# Patient Record
Sex: Male | Born: 1985 | Race: Black or African American | Hispanic: No | Marital: Single | Smoking: Never smoker
Health system: Southern US, Community
[De-identification: ages and names within clinical notes are randomized; demographics above are authoritative.]

## PROBLEM LIST (undated history)

## (undated) DIAGNOSIS — K469 Unspecified abdominal hernia without obstruction or gangrene: Secondary | ICD-10-CM

## (undated) DIAGNOSIS — Z803 Family history of malignant neoplasm of breast: Secondary | ICD-10-CM

## (undated) DIAGNOSIS — R030 Elevated blood-pressure reading, without diagnosis of hypertension: Secondary | ICD-10-CM

## (undated) DIAGNOSIS — M24419 Recurrent dislocation, unspecified shoulder: Secondary | ICD-10-CM

## (undated) DIAGNOSIS — F319 Bipolar disorder, unspecified: Secondary | ICD-10-CM

## (undated) DIAGNOSIS — F29 Unspecified psychosis not due to a substance or known physiological condition: Secondary | ICD-10-CM

## (undated) DIAGNOSIS — F32A Depression, unspecified: Secondary | ICD-10-CM

## (undated) HISTORY — DX: Unspecified abdominal hernia without obstruction or gangrene: K46.9

## (undated) HISTORY — DX: Family history of malignant neoplasm of breast: Z80.3

## (undated) HISTORY — DX: Recurrent dislocation, unspecified shoulder: M24.419

## (undated) HISTORY — DX: Elevated blood-pressure reading, without diagnosis of hypertension: R03.0

## (undated) HISTORY — DX: Bipolar disorder, unspecified: F31.9

## (undated) HISTORY — DX: Depression, unspecified: F32.A

## (undated) HISTORY — DX: Unspecified psychosis not due to a substance or known physiological condition: F29

---

## 2005-09-27 ENCOUNTER — Emergency Department: Admit: 2005-09-27 | Payer: Self-pay | Source: Emergency Department | Admitting: Emergency Medicine

## 2007-02-25 ENCOUNTER — Ambulatory Visit: Payer: Self-pay | Admitting: Internal Medicine

## 2007-03-02 ENCOUNTER — Ambulatory Visit: Payer: Self-pay | Admitting: Internal Medicine

## 2009-12-01 ENCOUNTER — Emergency Department (HOSPITAL_COMMUNITY): Admission: EM | Admit: 2009-12-01 | Discharge: 2009-12-01 | Payer: Self-pay | Admitting: Emergency Medicine

## 2010-04-13 ENCOUNTER — Emergency Department: Admit: 2010-04-13 | Payer: Self-pay | Source: Emergency Department | Admitting: Emergency Medicine

## 2011-04-04 NOTE — Assessment & Plan Note (Signed)
[facility]                        [facility] OFFICE NOTE   NAME:Schoen, EVANGELOS PAULINO                     MRN:          [id number]  DATE:03/02/2007                            DOB:          01-23-1986    SUBJECTIVE:  Raji Glinski was seen on March 02, 2007, for what he  presumed was a spider bite.  On February 25, 2007, he noticed a lump on  his left buttock.  There was progressive redness and swelling associated  with pruritus.  On February 28, 2007, he noted a similar lesion on the  right buttock.  He denies fevers, chills, sweats or weight loss.  He has  been using Cortaid over-the-counter, with minimal benefit.   He has no pet exposure and has not been in a hot tub.  He was on a  riding lawnmower on February 24, 2007, and contacted poison ivy.  He was  seen on February 25, 2007, for contact dermatitis around the left eye.  He  was placed on Cortaid to the lower lid b.i.d. and prednisone in  decreasing doses as well as Xyzal.   The periorbital edema has resolved.  He has minor linear lesions of  contact dermatitis over the left forearm.   PHYSICAL EXAMINATION:  Over the buttocks he has two large plaques, the  left being 7 cm x 4.5 cm and the right being 4.5 cm x 3 cm.  There is a  smaller on the right which is 8 mm x 9 mm and an extension of the left  buttock lesion which is 1.5 cm x 1.5 cm.  It is papular and erythematous  and somewhat urticarial in appearance.  He has mild eschar on the left  buttock and some hyperemia centrally on the right buttock lesion.  There  is mild dermatographia.   IMPRESSION/RECOMMENDATIONS:  The symmetry and lack of exposure suggests  contact dermatitis.  He does wear his trousers low over his hips and is  wearing a belt which appears to be synthetic material.  The most  important thing will be for him to monitor for any agents or materials  with which he comes into contact.  He should avoid sensitizing materials  such as  Lidocaine or triple antibiotic ointment.  A potent topical  steroid will be used along with hydroxyzine when he is not operating a  vehicle.  He should report fever or increasing size of the lesions.  If  there is progression, then aDermatology consultation and possibly biopsy  will be necessary.     Titus Dubin. Alwyn Ren, MD,FACP,FCCP  Electronically Signed    [facility]  DD: 03/02/2007  DT: 03/02/2007  Job #: [id number]

## 2011-05-27 ENCOUNTER — Encounter: Payer: Self-pay | Admitting: Internal Medicine

## 2011-05-27 ENCOUNTER — Ambulatory Visit (INDEPENDENT_AMBULATORY_CARE_PROVIDER_SITE_OTHER): Payer: PRIVATE HEALTH INSURANCE | Admitting: Internal Medicine

## 2011-05-27 DIAGNOSIS — N5089 Other specified disorders of the male genital organs: Secondary | ICD-10-CM

## 2011-05-27 DIAGNOSIS — N508 Other specified disorders of male genital organs: Secondary | ICD-10-CM

## 2011-05-27 DIAGNOSIS — R030 Elevated blood-pressure reading, without diagnosis of hypertension: Secondary | ICD-10-CM

## 2011-05-27 DIAGNOSIS — K4090 Unilateral inguinal hernia, without obstruction or gangrene, not specified as recurrent: Secondary | ICD-10-CM | POA: Insufficient documentation

## 2011-05-27 NOTE — Assessment & Plan Note (Signed)
BP slightly elevated today, no history of hypertension, patient is moderately anxious. Recheck in 6 months, certainly if his blood pressure is elevated at the time of his surgical evaluation, we can start him on medication.

## 2011-05-27 NOTE — Assessment & Plan Note (Signed)
Has a right scrotal mass, on further questioning the patient, he self-exam his testicle frequently and the mass has been present there for while. To confirm that this is a benign finding, will schedule ultrasound..  Recommend to continue with self testicular exam.

## 2011-05-27 NOTE — Assessment & Plan Note (Signed)
Findings consistent with a inguinal hernia, will refer to surgery, in the meantime he knows to call me if he has severe pain or the mass is nonreducible

## 2011-05-27 NOTE — Progress Notes (Signed)
  Subjective:    Patient ID: Rick Lewis, male    DOB: 1986-01-14, 25 y.o.   MRN: [id number]  HPI New patient, last office visit 2008. Few weeks history of discomfort at the right suprapubic area described as a pulled muscle. Symptoms got worse last week when he went out of town and did a lot of walking. Keeping pressure in the area would relief the pain, sitting relief the pain. He now feels a reducible lump in there.  No past medical history on file.  No past surgical history on file.  Family History  Problem Relation Age of Onset  . Colon cancer Neg Hx   . Prostate cancer Neg Hx   . Diabetes Neg Hx   . Coronary artery disease Neg Hx    History   Social History  . Marital Status: Married    Spouse Name: N/A    Number of Children: 1  . Years of Education: N/A   Occupational History  . roofing     Social History Main Topics  . Smoking status: Never Smoker   . Smokeless tobacco: Not on file  . Alcohol Use: Yes     socially   . Drug Use: No  . Sexually Active: Not on file   Other Topics Concern  . Not on file   Social History Narrative  . No narrative on file    Review of Systems No fever or chills No nausea, vomiting, diarrhea or blood in the stools. Denies any scrotal swelling or pain    Objective:   Physical Exam  Constitutional: He appears well-developed and well-nourished. No distress.  Cardiovascular: Normal rate, regular rhythm and normal heart sounds.   Pulmonary/Chest: Effort normal and breath sounds normal. No respiratory distress. He has no wheezes. He has no rales.  Genitourinary:     Musculoskeletal: He exhibits no edema.  Skin: He is not diaphoretic.          Assessment & Plan:

## 2011-05-28 ENCOUNTER — Telehealth: Payer: Self-pay | Admitting: *Deleted

## 2011-05-28 DIAGNOSIS — N5089 Other specified disorders of the male genital organs: Secondary | ICD-10-CM

## 2011-05-28 NOTE — Telephone Encounter (Signed)
Message copied by Leanne Lovely on Wed May 28, 2011  8:27 AM ------      Message from: Willow Ora E      Created: Tue May 27, 2011  6:01 PM       Please schedule a scrotal u/s---dx scrotal mas

## 2011-05-30 ENCOUNTER — Ambulatory Visit
Admission: RE | Admit: 2011-05-30 | Discharge: 2011-05-30 | Disposition: A | Discharge status: Home / Self Care | Payer: PRIVATE HEALTH INSURANCE | Source: Ambulatory Visit | Attending: Internal Medicine | Admitting: Internal Medicine

## 2011-05-30 DIAGNOSIS — N5089 Other specified disorders of the male genital organs: Secondary | ICD-10-CM

## 2011-06-06 ENCOUNTER — Encounter (INDEPENDENT_AMBULATORY_CARE_PROVIDER_SITE_OTHER): Payer: Self-pay | Admitting: General Surgery

## 2011-06-06 ENCOUNTER — Ambulatory Visit (INDEPENDENT_AMBULATORY_CARE_PROVIDER_SITE_OTHER): Payer: PRIVATE HEALTH INSURANCE | Admitting: General Surgery

## 2011-06-06 VITALS — BP 154/72 | HR 68 | Temp 97.9°F | Ht 77.0 in | Wt 271.4 lb

## 2011-06-06 DIAGNOSIS — K4090 Unilateral inguinal hernia, without obstruction or gangrene, not specified as recurrent: Secondary | ICD-10-CM

## 2011-06-06 NOTE — Patient Instructions (Signed)
Inguinal Hernia, Adult Muscles help keep everything in the body in its proper place. But if a weak spot in the muscles develops, something can poke through. That is called a hernia. When this happens in the lower part of the belly (abdomen), it is called an inguinal hernia. (It takes its name from a part of the body in this region called the inguinal canal.) A weak spot in the wall of muscles lets some fat or part of the small intestine bulge through. An inguinal hernia can develop at any age. Men get them more often than women. CAUSES In adults, an inguinal hernia develops over time.  It can be triggered by:   Suddenly straining the muscles of the lower abdomen.   Lifting heavy objects.   Straining to have a bowel movement. Difficult bowel movements (constipation) can lead to this.   Constant coughing. This may be caused by smoking or lung disease.   Being overweight.   Being pregnant.   Working at a job that requires long periods of standing or heavy lifting.   Having had an inguinal hernia before.  One type can be an emergency situation. It is called a strangulated inguinal hernia. It develops if part of the small intestine slips through the weak spot and cannot get back into the abdomen. The blood supply can be cut off. If that happens, part of the intestine may die. This situation requires emergency surgery. SYMPTOMS Often, a small inguinal hernia has no symptoms. It is found when a healthcare provider does a physical exam. Larger hernias usually have symptoms.   In adults, symptoms may include:   A lump in the groin. This is easier to see when the person is standing. It might disappear when lying down.   In men, a lump in the scrotum.   Pain or burning in the groin. This occurs especially when lifting, straining or coughing.   A dull ache or feeling of pressure in the groin.   Signs of a strangulated hernia can include:   A bulge in the groin that becomes very painful and  tender to the touch.   A bulge that turns red or purple.   Fever, nausea and vomiting.   Inability to have a bowel movement or to pass gas.  DIAGNOSIS To decide if you have an inguinal hernia, a healthcare provider will probably do a physical examination.  This will include asking questions about any symptoms you have noticed.   The healthcare provider might feel the groin area and ask you to cough. If an inguinal hernia is felt, the healthcare provider may try to slide it back into the abdomen.   Usually no other tests are needed.  TREATMENT Treatments can vary. The size of the hernia makes a difference. Options include:  Watchful waiting. This is often suggested if the hernia is small and you have had no symptoms.   No medical procedure will be done unless symptoms develop.   You will need to watch closely for symptoms. If any occur, contact your healthcare provider right away.   Surgery. This is used if the hernia is larger or you have symptoms.   Open surgery. This is usually an outpatient procedure (you will not stay overnight in a hospital). An cut (incision) is made through the skin in the groin. The hernia is put back inside the abdomen. The weak area in the muscles is then repaired by:  --Herniorrhaphy. In this type of surgery, the weak muscles are sewn  back together. --Hernioplasty. A patch or mesh is used to close the weak area in the abdominal wall.   Laparoscopy. In this procedure, a surgeon makes small incisions. A thin tube with a tiny video camera (called a laparoscope) is put into the abdomen. The surgeon repairs the hernia with mesh by looking with the video camera and using two long instruments.  HOME CARE INSTRUCTIONS  After surgery to repair an inguinal hernia:   You will need to take pain medicine prescribed by your healthcare provider. Follow all directions carefully.   You will need to take care of the wound from the incision.   Your activity will be  restricted for awhile. This will probably include no heavy lifting for several weeks. You also should not do anything too active for a few weeks. When you can return to work will depend on the type of job that you have.   During "watchful waiting" periods, you should:   Maintain a healthy weight.   Eat a diet high in fiber (fruits, vegetables and whole grains).   Drink plenty of fluids to avoid constipation. This means drinking enough water and other liquids to keep your urine clear or pale yellow.   Do not lift heavy objects.   Do not stand for long periods of time.   Quit smoking. This should keep you from developing a frequent cough.  SEEK MEDICAL CARE IF:  A bulge develops in your groin area.   You feel pain, a burning sensation or pressure in the groin. This might be worse if you are lifting or straining.   You develop a fever of more than 100.58F (38.1 C).  SEEK IMMEDIATE MEDICAL CARE IF:  Pain in the groin increases suddenly.   A bulge in the groin gets bigger suddenly and does not go down.   For men, there is sudden pain in the scrotum. Or, the size of the scrotum increases.   A bulge in the groin area becomes red or purple and is painful to touch.   You have nausea or vomiting that does not go away.   You feel your heart beating much faster than normal.   You cannot have a bowel movement or pass gas.   You develop a fever of more than 102.36F (38.9C).  Document Released: 03/22/2009 Document Re-Released: 04/23/2010 [facility] Patient Information 2011 [place], [place].  Hernia, Repair with Laparoscope A hernia occurs when an internal organ pushes out through a weak spot in the belly (abdominal) wall muscles. Hernias most commonly occur in the groin and around the navel. Hernias can also occur through a cut by the surgeon (incision) after an abdominal operation. A hernia may be caused by:  Lifting heavy objects.   Prolonged coughing.   Straining to move your  bowels.  Hernias can often be pushed back into place (reduced). Most hernias tend to get worse over time. Problems occur when abdominal contents get stuck in the opening and the blood supply is blocked or impaired (incarcerated hernia). Because of these risks, you require surgery to repair the hernia. Your hernia will be repaired using a laparoscope. Laparoscopic surgery is a type of minimally invasive surgery. It does not involve making a typical surgical cut (incision) in the skin. A laparoscope is a telescope-like rod and lens system. It is usually connected to a video camera and a light source so your caregiver can clearly see the operative area. The instruments are inserted through  to  inch (5 mm or 10  mm) openings in the skin at specific locations. A working and viewing space is created by blowing a small amount of carbon dioxide gas into the abdominal cavity. The abdomen is essentially blown up like a balloon (insufflated). This elevates the abdominal wall above the internal organs like a dome. The carbon dioxide gas is common to the human body and can be absorbed by tissue and removed by the respiratory system. Once the repair is completed, the small incisions will be closed with either stitches (sutures) or staples (just like a paper stapler only this staple holds the skin together). BEFORE THE PROCEDURE Laparoscopy can be done either in a hospital or out-patient clinic. You may be given a mild sedative to help you relax before the procedure. Once in the operating room, you will be given a general anesthesia to make you sleep (unless you and your caregiver choose a different anesthetic).  LET YOUR CAREGIVERS KNOW ABOUT THE FOLLOWING:  Allergies.  Medications taken including herbs, eye drops, over the counter medications, and creams.   Use of steroids (by mouth or creams).   Previous problems with anesthetics or Novocaine.   Possibility of pregnancy, if this applies.  History of blood  clots (thrombophlebitis).   History of bleeding or blood problems.   Previous surgery.   Other health problems.   AFTER THE PROCEDURE After the procedure you will be watched in a recovery area. Depending on what type of hernia was repaired, you might be admitted to the hospital or you might go home the same day. With this procedure you may have less pain and scarring. This usually results in a quicker recovery and less risk of infection. HOME CARE INSTRUCTIONS  Bed rest is not required. You may continue your normal activities but avoid heavy lifting (more than 10 pounds) or straining.   Cough gently. If you are a smoker it is best to stop, as even the best hernia repair can break down with the continual strain of coughing.   Avoid driving until given the OK by your surgeon.   There are no dietary restrictions unless given otherwise.   TAKE ALL MEDICATIONS AS DIRECTED.   Only take over-the-counter or prescription medicines for pain, discomfort, or fever as directed by your caregiver.  SEEK MEDICAL CARE IF:  There is increasing abdominal pain or pain in your incisions.   There is more bleeding from incisions, other than minimal spotting.   You feel light headed or faint.   You develop an unexplained temperature, chills, and/or an oral temperature above101.   You have redness, swelling, or increasing pain in the wound.   Pus coming from wound.   A foul smell coming from the wound or dressings.  SEEK IMMEDIATE MEDICAL CARE IF:  You develop a rash.   You have difficulty breathing.   You have any allergic problems.  MAKE SURE YOU:   Understand these instructions.   Will watch your condition.   Will get help right away if you are not doing well or get worse.  Document Released: 11/03/2005 Document Re-Released: 10/22/2009 [facility] Patient Information 2011 [place], [place].

## 2011-06-06 NOTE — Progress Notes (Signed)
Rick Lewis is a 25 y.o. male.    Chief Complaint  Patient presents with  . Other    Eval of inguinal hernia    HPI HPI 25 year old Caucasian male referred by Dr. Willow Ora for evaluation of a right inguinal hernia. The patient states that he's been having sharp pain in his right groin for the past several weeks. It doesn't bother him if he sitting down. However after standing for at least 10 minutes he'll have a sharp, burning sensation in his right groin. It does not radiate. He has felt a bulge in that area, but he has not seen a bulge. He denies any trauma to the area. He works in [employer]. He denies any nausea, vomiting, diarrhea, or constipation. He denies any dysuria. He also states that he has had a lump in his right scrotum for at least 3 years. It has not changed in size. He denies any lymphadenopathy. He underwent an ultrasound of his scrotum recently.  Past Medical History  Diagnosis Date  . Hernia   . Family history of breast cancer     mother and grandmother  . Shoulder dislocation, recurrent     right  . Elevated blood pressure reading without diagnosis of hypertension     History reviewed. No pertinent past surgical history.  Family History  Problem Relation Age of Onset  . Colon cancer Neg Hx   . Prostate cancer Neg Hx   . Diabetes Neg Hx   . Coronary artery disease Neg Hx   . Cancer Mother     breast    Social History History  Substance Use Topics  . Smoking status: Never Smoker   . Smokeless tobacco: Not on file  . Alcohol Use: Yes     socially     No Known Allergies  No current outpatient prescriptions on file.    Review of Systems Review of Systems  Constitutional: Negative for fever, chills and weight loss.  HENT: Negative.        Contacts  Eyes: Negative.   Respiratory: Negative for shortness of breath.   Cardiovascular: Negative for chest pain and orthopnea.  Gastrointestinal: Negative for nausea and vomiting.  Genitourinary:  Negative for dysuria, urgency and hematuria.       See hpi  Musculoskeletal:       H/o recurrent rt shoulder dislocation  Skin: Negative.   Neurological: Negative.   Endo/Heme/Allergies: Negative.   Psychiatric/Behavioral: Negative.     Physical Exam Physical Exam  Vitals reviewed. Constitutional: He is oriented to person, place, and time. He appears well-developed and well-nourished.       Tall, overweight  HENT:  Head: Normocephalic and atraumatic.  Eyes: Conjunctivae are normal. No scleral icterus.  Neck: Normal range of motion. Neck supple. No tracheal deviation present.  Cardiovascular: Normal rate, regular rhythm and normal heart sounds.   Respiratory: Effort normal and breath sounds normal. No respiratory distress. He has no wheezes.  GI: Soft. Bowel sounds are normal. He exhibits no distension. There is no tenderness. A hernia is present. Hernia confirmed positive in the right inguinal area (bulge on valsalva only). Hernia confirmed negative in the left inguinal area.  Genitourinary: Penis normal. Right testis shows mass (marble sized mass superior to testicle, well circumscribed). Right testis shows no swelling and no tenderness. Left testis shows no mass, no swelling and no tenderness.  Musculoskeletal: Normal range of motion.  Lymphadenopathy:       Right: No inguinal adenopathy present.  Left: No inguinal adenopathy present.  Neurological: He is alert and oriented to person, place, and time.  Skin: Skin is warm and dry.  Psychiatric: He has a normal mood and affect. His behavior is normal. Judgment and thought content normal.     Blood pressure 154/72, pulse 68, temperature 97.9 F (36.6 C), temperature source Temporal, height 6\' 5"  (1.956 m), weight 271 lb 6.4 oz (123.106 kg).  ULTRASOUND OF SCROTUM  Technique: Complete ultrasound examination of the testicles,  epididymis, and other scrotal structures was performed.  Comparison: None.  Findings:  Right  testis: 4.7 x 2.8 x 3.4 cm. Normal gray scale appearance.  Normal color Doppler.  Left testis: 4.8 x 2.5 x 3.2 cm. Normal gray scale appearance.  Normal color Doppler.  Right epididymis: Large cyst versus spermatocele. 1.8 x 1.5 x 2.4  cm.  Left epididymis: 3 mm epididymal cyst versus spermatocele.  Hydrocele: Absent  Varicocele: Absent  No convincing evidence of inguinal hernia on focused ultrasound.  IMPRESSION:  1. Large right epididymal cyst versus spermatocele. This may  account for the palpable abnormality.  2. No evidence of testicular mass.   Assessment/Plan 25 year old Caucasian male with a right inguinal hernia.   His overall physical exam is consistent with an inguinal hernia. I had one of my partners come in and also examine him. He concurred as well there was a bulge within the right inguinal canal. The patient does have a decent amount of adipose tissue in his suprapubic area which makes physical exam a little bit challenging. I explained to him there may be a small chance that this is not an inguinal hernia. We discussed other imaging modalities such as a CT scan. I explained that a CT scan is not 100% sensitive or specific. If the CT scan did not reveal an inguinal hernia, we would still be left with a bulge in the right inguinal canal along with inguinal pain. Therefore I've recommended a laparoscopic approach.  We discussed the etiology of hernias. We discussed nonoperative and operative management. The patient is specifically interested in surgical intervention.  I described the procedure in detail.  The patient was given educational material. We discussed the risks and benefits including but not limited to bleeding, infection, chronic inguinal pain, nerve entrapment, hernia recurrence, mesh complications, hematoma formation, urinary retention, injury to the testicles or the ovaries, numbness in the groin, blood clots, injury to the surrounding structures, and anesthesia risk.  We also discussed the typical post operative recovery course, including no heavy lifting for 6 weeks.  We'll plan on doing a laparoscopic repair of a right inguinal hernia. Gaynelle Adu M 06/06/2011, 6:44 PM

## 2011-06-11 ENCOUNTER — Ambulatory Visit (INDEPENDENT_AMBULATORY_CARE_PROVIDER_SITE_OTHER): Payer: PRIVATE HEALTH INSURANCE | Admitting: Surgery

## 2011-06-13 ENCOUNTER — Other Ambulatory Visit (INDEPENDENT_AMBULATORY_CARE_PROVIDER_SITE_OTHER): Payer: Self-pay | Admitting: General Surgery

## 2011-06-13 ENCOUNTER — Encounter (HOSPITAL_COMMUNITY): Payer: PRIVATE HEALTH INSURANCE

## 2011-06-13 LAB — BASIC METABOLIC PANEL
BUN: 14 mg/dL (ref 6–23)
Calcium: 9.6 mg/dL (ref 8.4–10.5)
Chloride: 103 mEq/L (ref 96–112)
Creatinine, Ser: 0.83 mg/dL (ref 0.50–1.35)
GFR calc Af Amer: >60 mL/min (ref >60)
GFR calc non Af Amer: >60 mL/min (ref >60)
Glucose, Bld: 98 mg/dL (ref 70–99)
Sodium: 140 mEq/L (ref 135–145)

## 2011-06-13 LAB — CBC
HCT: 44.9 % (ref 39.0–52.0)
Hemoglobin: 15.8 g/dL (ref 13.0–17.0)
MCH: 30.4 pg (ref 26.0–34.0)
MCV: 86.3 fL (ref 78.0–100.0)
RBC: 5.2 MIL/uL (ref 4.22–5.81)
RDW: 12.1 % (ref 11.5–15.5)

## 2011-06-13 LAB — URINALYSIS, ROUTINE W REFLEX MICROSCOPIC
Bilirubin Urine: NEGATIVE
Leukocytes, UA: NEGATIVE
Nitrite: NEGATIVE
Specific Gravity, Urine: 1.022 (ref 1.005–1.030)
pH: 6 (ref 5.0–8.0)

## 2011-06-13 LAB — SURGICAL PCR SCREEN: Staphylococcus aureus: POSITIVE — AB

## 2011-06-13 LAB — DIFFERENTIAL
Lymphocytes Relative: 32 % (ref 12–46)
Neutro Abs: 3.8 10*3/uL (ref 1.7–7.7)

## 2011-06-20 ENCOUNTER — Ambulatory Visit (HOSPITAL_COMMUNITY)
Admission: RE | Admit: 2011-06-20 | Discharge: 2011-06-20 | Disposition: A | Discharge status: Home / Self Care | Payer: PRIVATE HEALTH INSURANCE | Source: Ambulatory Visit | Attending: General Surgery | Admitting: General Surgery

## 2011-06-20 DIAGNOSIS — K4090 Unilateral inguinal hernia, without obstruction or gangrene, not specified as recurrent: Secondary | ICD-10-CM | POA: Insufficient documentation

## 2011-06-20 DIAGNOSIS — Z01812 Encounter for preprocedural laboratory examination: Secondary | ICD-10-CM | POA: Insufficient documentation

## 2011-06-20 DIAGNOSIS — Z01810 Encounter for preprocedural cardiovascular examination: Secondary | ICD-10-CM | POA: Insufficient documentation

## 2011-06-24 NOTE — Op Note (Signed)
NAME:  Rick Lewis, Rick Lewis              ACCOUNT NO.:  [id number]  MEDICAL RECORD NO.:  [id number]  LOCATION:  DAYL                         FACILITY:  [facility]  PHYSICIAN:  Mary Sella. Andrey Campanile, MD     DATE OF BIRTH:  Apr 06, 1986  DATE OF PROCEDURE:  06/20/2011 DATE OF DISCHARGE:  06/20/2011                              OPERATIVE REPORT   PREOPERATIVE DIAGNOSIS:  Right inguinal hernia.  POSTOPERATIVE DIAGNOSIS:  Right indirect inguinal hernia.  PROCEDURE:  Laparoscopic repair of right indirect inguinal hernia with mesh.  SURGEON:  Mary Sella.  Andrey Campanile, MD  ASSISTANT SURGEON:  None.  ANESTHESIA:  General plus 20 mL of Exparel plus 4% Marcaine with epi.  FINDINGS:  No evidence of left inguinal hernia.  The patient had a defect lateral to the inferior epigastric vessels.  I used 3 inch x 6 inch piece of Ethicon Ultrapro mesh to repair the defect in a Tapp fashion.  ESTIMATED BLOOD LOSS:  Minimal.  COMPLICATIONS:  None immediately apparent.  INDICATIONS FOR PROCEDURE:  The patient is a very pleasant 25 year old overweight Caucasian male who has been having sharp pain in his groin for the past several weeks.  It only bothers him after standing up for a short period of time.  He describes it as a burning sensation in his groin.  He has felt a bulge in that area, but he has not seen a bulge. We discussed the risk and benefits of the procedure including, but not limited to bleeding, infection, injury to surrounding structures, need to convert to an open procedure, testicular injury, chronic inguinal pain, mesh complications, hernia recurrence, urinary retention, blood clot formation as well as incisional hernia formation, and the patient elected to proceed with surgery.  DESCRIPTION OF PROCEDURE:  The patient's operative site was marked in the holding bay with the patient confirming the right side.  He was then taken back to the operating room, where general endotracheal anesthesia was  established.  Sequential compression devices were placed.  A Foley catheter was placed.  His abdomen was prepped and draped in usual standard surgical fashion.  He received 2 grams of Ancef prior to skin incision.  Local was infiltrated at the base of his umbilicus.  Next, a vertical 1-inch infraumbilical incision was made with a #11 blade.  The fascia was grasped and lifted anteriorly.  Next, the fascia was incised with #11 blade and the abdominal cavity was entered.  A pursestring suture consisting of 0-Vicryl on a  UR6 needle was placed around the fascial edges.  The Hasson trocar was introduced and pneumoperitoneum was smoothly established to a patient pressure of 15 mmHg.  Laparoscope was advanced and the abdominal cavity was surveilled.  There was no evidence of a left inguinal hernia.  The patient had a right inguinal hernia since it was lateral to the inferior gastric vessels.  He was placed in Trendelenburg position.  Two additional 5-mm trocars were placed at the level of the umbilicus in the left and right midclavicular lines under direct visualization.  I began by making an incision in his peritoneum on the right starting several inches above the anterior superior iliac spine and carrying it medial to  the median umbilical ligament in a lazy S configuration.  The peritoneal flap was dissected down from the anterior abdominal wall.  I had identified the inferior epigastric vessels and they were protected.  I was able to bluntly dissect medially with two atraumatic graspers and identified the pubic tubercle.  Again, the inferior epigastric vessels were identified as well as the testicular vessels.  I then began reducing the hernia sac with traction and counter traction using two graspers.  He had a very large sac and it took about 20 minutes to reduce it in its entirety. The sac was stripped away from the cord structures as well as from the vas deferens.  I then continued lifting  up the peritoneum off the pelvic floor.  At this point, I had a large pocket.  I then placed a 3 inch x 6 inch piece of Ethicon Ultrapro mesh into the right groin.  It was unrolled and laid flush against the right groin.  Half of it was lateral to the inferior epigastric vessels and half of it was medial.  I then used an Ethicon secure strap to secure the mesh to the abdominal wall in several places.  One tack was placed in Cooper's ligament and one tack was placed superior medially.  One tack was placed on each side of the inferior epigastric vessels and two tacks were placed out laterally.  No tacks were placed below the shelving edge of the inguinal ligament.  A total of seven tacks were used.  At this point, I reduced intra- abdominal pressure.  I brought the peritoneal flap back up to the abdominal wall and brought the edges of the peritoneal flap back together using an Ethicon EMS titanium stapler to keep the edges of the peritoneum together.  I did use some additional tack to help secure the peritoneum back into the abdominal wall.  The mesh was well covered. There was no exposed mesh.  Pneumoperitoneum was released and the trocars were removed.  The previously placed pursestring suture was tied down thus obliterating fascial defect.  I laid in place another additional single interrupted 0-Vicryl suture at the umbilicus through the fascia.  Skin incisions were closed with 4-0 Monocryl in a subcuticular fashion followed by application of Dermabond.  Prior to closing the skin at the umbilicus, I did inject the remaining Exparel at the umbilicus.  The patient was extubated, the Foley catheter was removed and he was taken to recovery room in stable condition.  There were no immediate complications.  The patient tolerated the procedure well.     Mary Sella. Andrey Campanile, MD     EMW/MEDQ  D:  06/20/2011  T:  06/21/2011  Job:  [id number]  cc:   Willow Ora, MD [phone] [place],  [place] [address]  Electronically Signed by Gaynelle Adu M.D. on 06/24/2011 10:48:49 AM

## 2011-07-10 ENCOUNTER — Encounter (INDEPENDENT_AMBULATORY_CARE_PROVIDER_SITE_OTHER): Payer: PRIVATE HEALTH INSURANCE | Admitting: General Surgery

## 2012-10-17 IMAGING — US US SCROTUM
1 series · 14 of 25 positions shown · non-contrast
Comparison: None.

CLINICAL DATA: Scrotal mass for 3 years.  Right scrotal lump.

ULTRASOUND OF SCROTUM
TECHNIQUE: Complete ultrasound examination of the testicles,
epididymis, and other scrotal structures was performed.

[Series 1: us scrotum · 0.08mm/px · 14 of 55 slices shown]
[im 1/55]
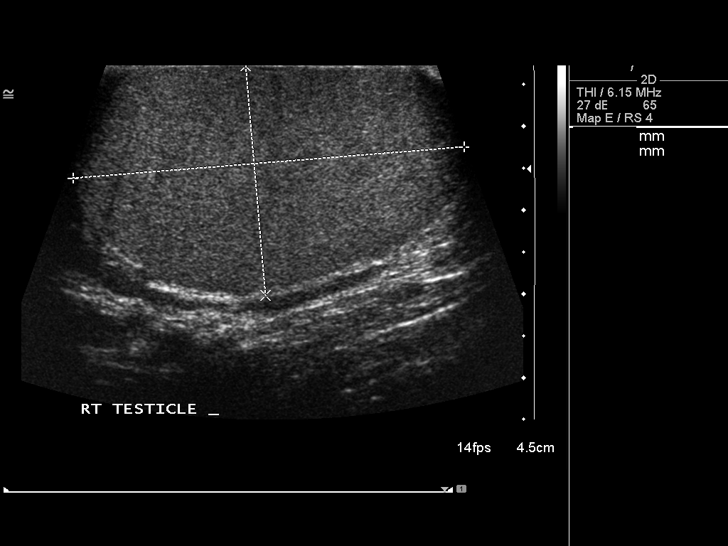
[im 5/55]
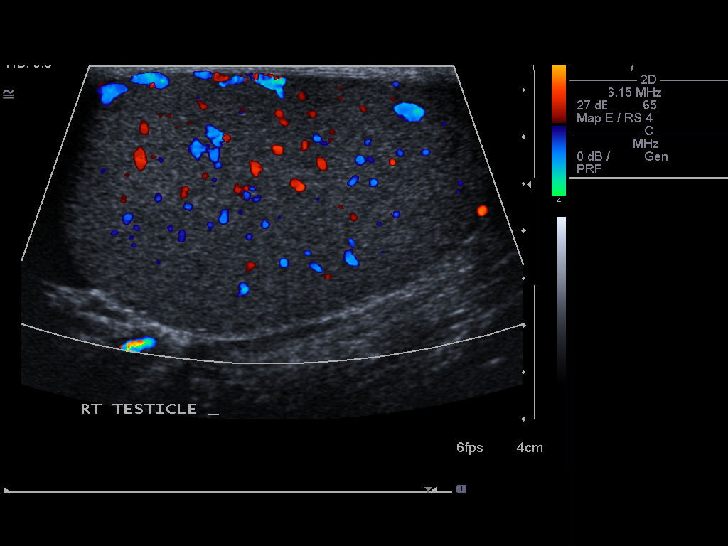
[im 10/55]
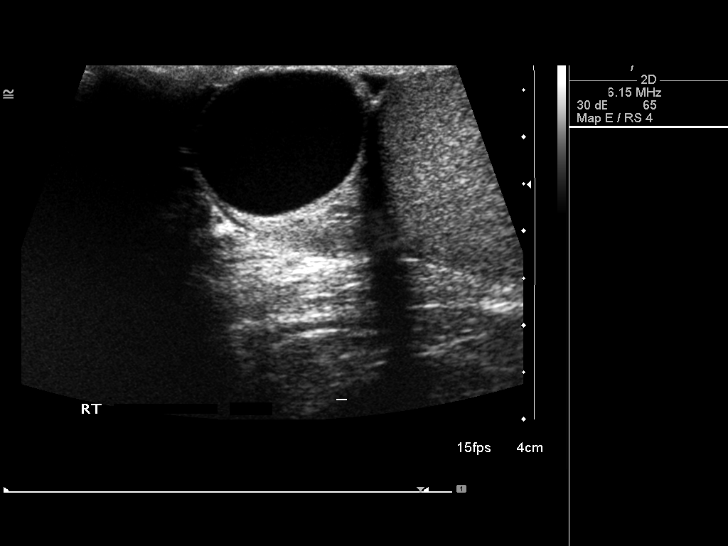
[im 14/55]
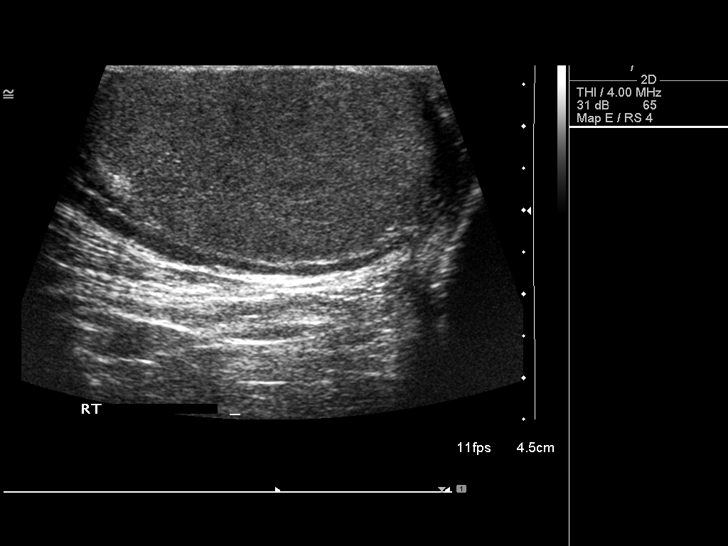
[im 19/55]
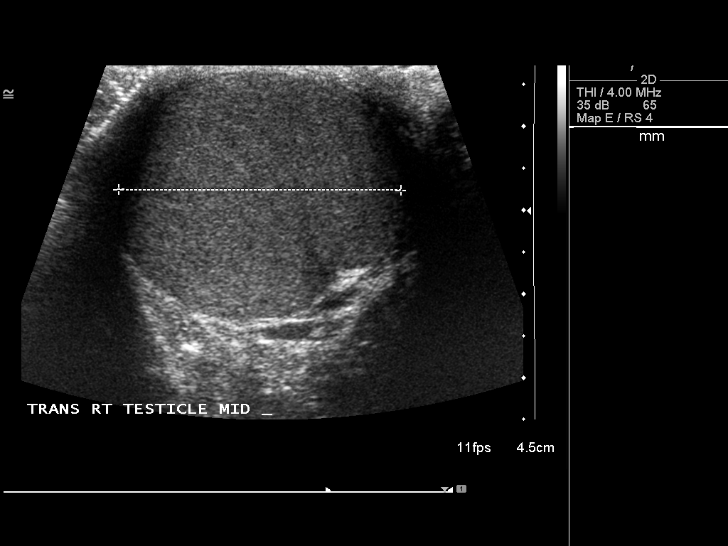
[im 21/55]
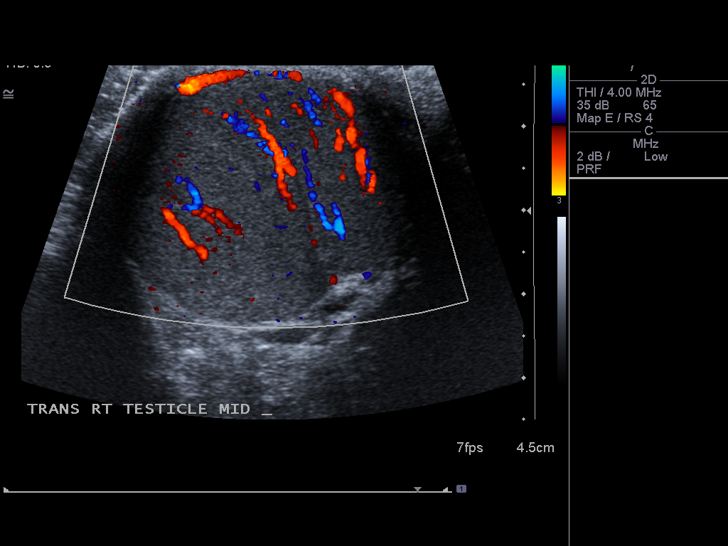
[im 25/55]
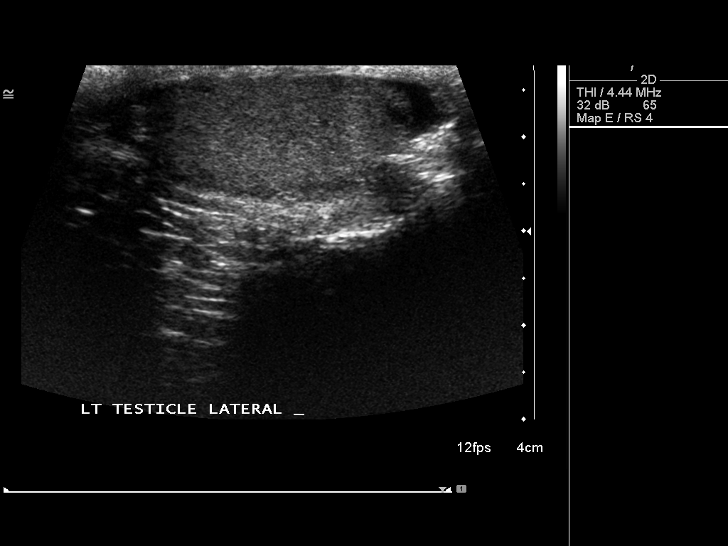
[im 30/55]
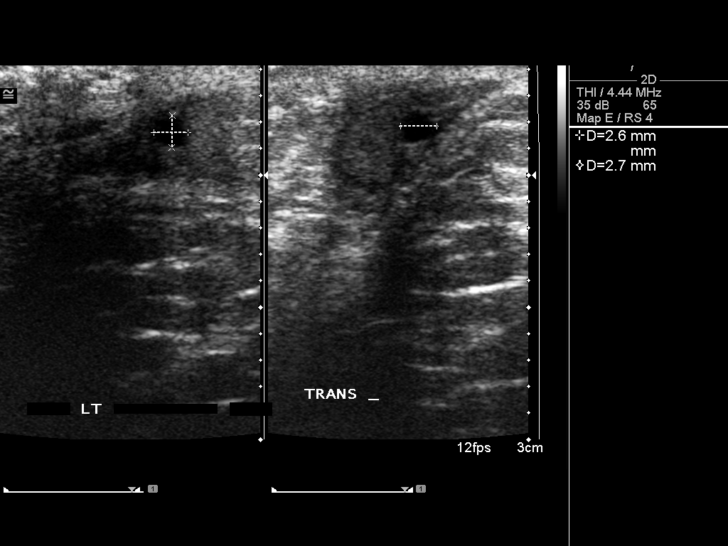
[im 34/55]
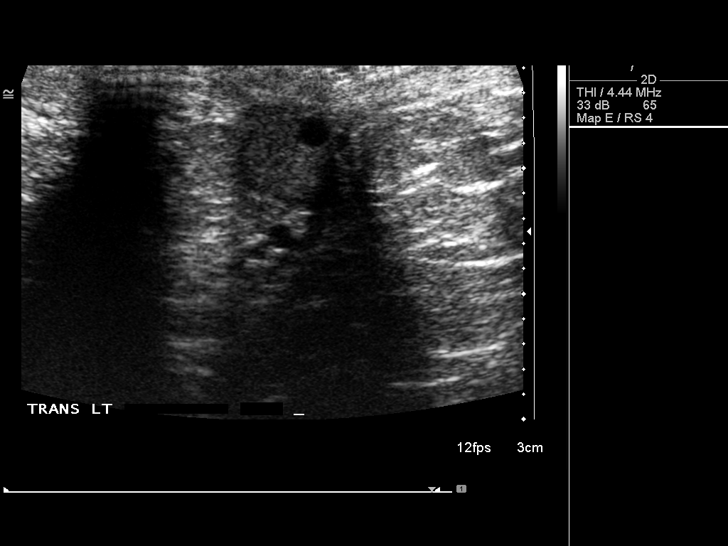
[im 37/55]
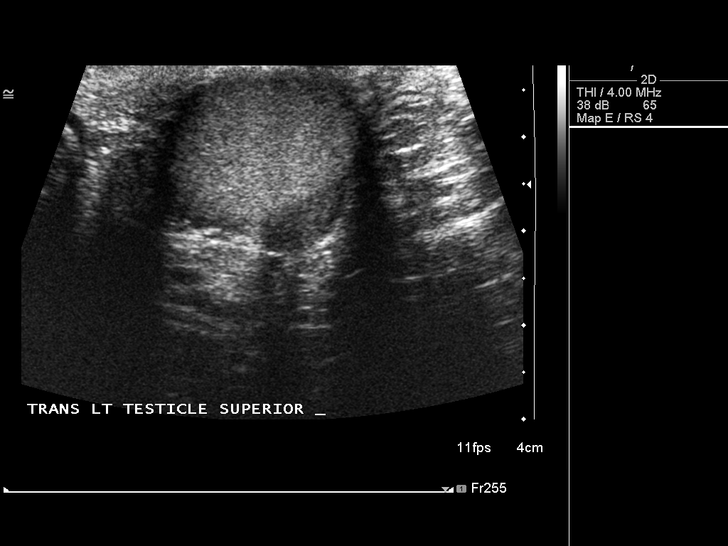
[im 41/55]
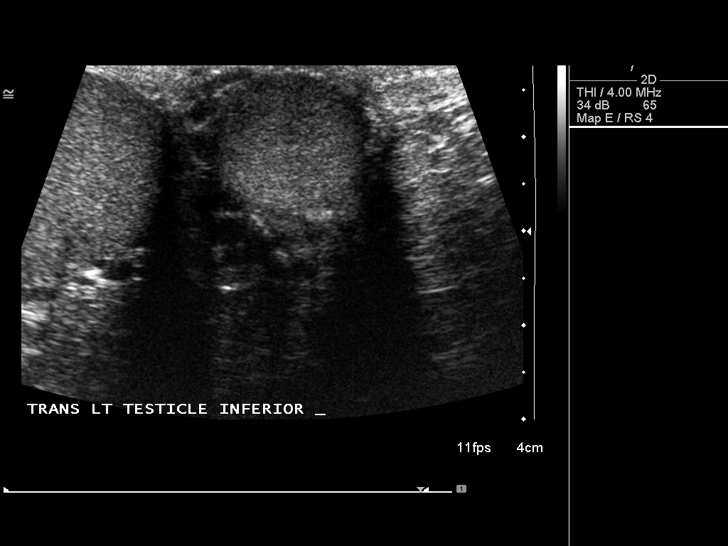
[im 46/55]
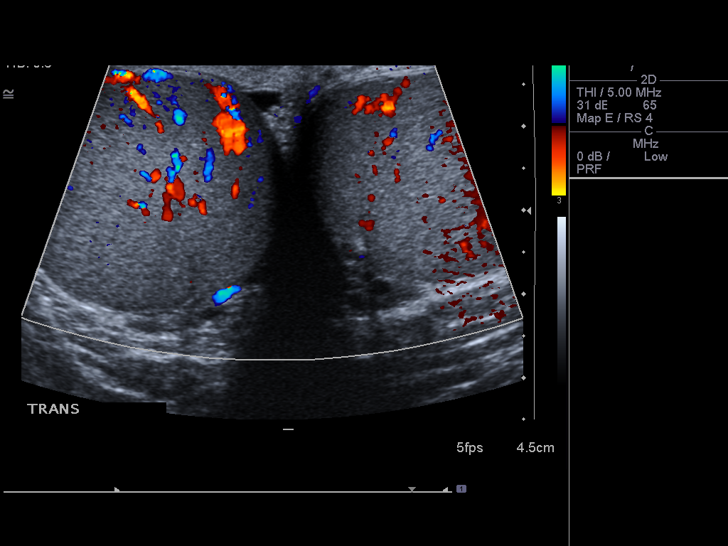
[im 50/55]
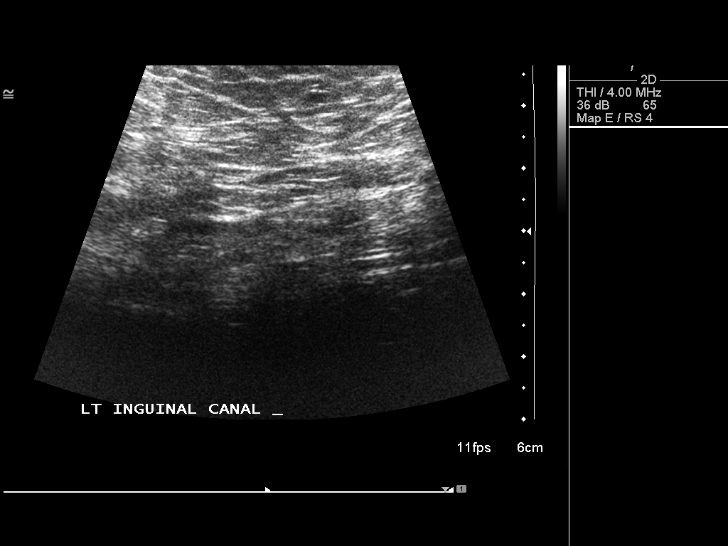
[im 55/55]
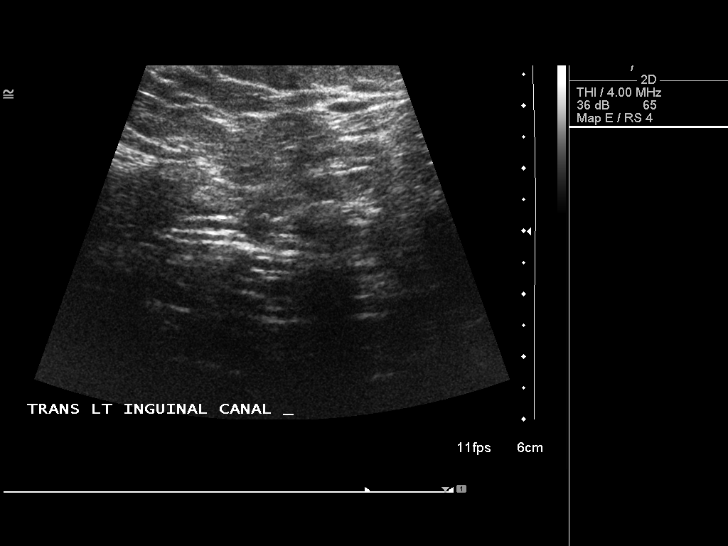

[14 of 25 positions shown; findings below may reference images not displayed]

FINDINGS: Right testis:  4.7 x 2.8 x 3.4 cm.  Normal gray scale appearance.
Normal color Doppler.

Left testis:  4.8 x 2.5 x 3.2 cm.  Normal gray scale appearance.
Normal color Doppler.

Right epididymis:  Large cyst versus spermatocele.  1.8 x 1.5 x
cm.

Left epididymis:  3 mm epididymal cyst versus spermatocele.

Hydrocele:  Absent

Varicocele:  Absent

No convincing evidence of inguinal hernia on focused ultrasound.
IMPRESSION: 1.  Large right epididymal cyst versus spermatocele.  This may
account for the palpable abnormality.
2.  No evidence of testicular mass.

## 2013-09-05 ENCOUNTER — Other Ambulatory Visit: Payer: Self-pay | Admitting: Occupational Medicine

## 2013-09-05 ENCOUNTER — Ambulatory Visit
Admission: RE | Admit: 2013-09-05 | Discharge: 2013-09-05 | Disposition: A | Discharge status: Home / Self Care | Payer: No Typology Code available for payment source | Source: Ambulatory Visit | Attending: Occupational Medicine | Admitting: Occupational Medicine

## 2013-09-05 DIAGNOSIS — Z021 Encounter for pre-employment examination: Secondary | ICD-10-CM

## 2015-10-12 ENCOUNTER — Ambulatory Visit (INDEPENDENT_AMBULATORY_CARE_PROVIDER_SITE_OTHER): Payer: No Typology Code available for payment source | Admitting: Family

## 2015-10-12 ENCOUNTER — Encounter (INDEPENDENT_AMBULATORY_CARE_PROVIDER_SITE_OTHER): Payer: Self-pay

## 2015-10-12 VITALS — BP 137/82 | HR 82 | Temp 98.0°F | Resp 16 | Ht 68.0 in | Wt 200.0 lb

## 2015-10-12 DIAGNOSIS — R059 Cough, unspecified: Secondary | ICD-10-CM

## 2015-10-12 DIAGNOSIS — R05 Cough: Secondary | ICD-10-CM

## 2015-10-12 DIAGNOSIS — H1032 Unspecified acute conjunctivitis, left eye: Secondary | ICD-10-CM

## 2015-10-12 MED ORDER — CIPROFLOXACIN HCL 0.3 % OP SOLN
1.0000 [drp] | OPHTHALMIC | Status: DC
Start: 2015-10-12 — End: 2019-12-08

## 2015-10-12 MED ORDER — BENZONATATE 100 MG PO CAPS
100.0000 mg | ORAL_CAPSULE | Freq: Three times a day (TID) | ORAL | Status: AC | PRN
Start: 2015-10-12 — End: 2016-10-11

## 2015-10-12 NOTE — Patient Instructions (Signed)
Thank you for choosing [facility]  Drink plenty of fluid, you can take over the counter cough medication     Please follow up with your primary care doctor in 3-4 days.       Please remember to wash your hands it is the best way to reduce illness and the spread of germs.         Conjunctivitis, Bacterial  You have a bacterial infection in the membranes covering the eye. The most common symptoms include a thick discharge from the eye, swollen eyelids, redness, eyelids sticking together upon awakening, and a gritty or scratchy feeling in the eye. The infection takes about 7-10 days to resolve with treatment.    Home Care:  1. Use prescribed eyedrops or ointment as directed to treat the infection.  2. Apply a warm pack (towel soaked in warm water) to the affected eye 3-4 times a day. Do this just before applying medicine to the eye.  3. Use a warm, wet cloth to wipe away crusting of the eyelids in the morning. This is caused by mucus drainage during the night. You may also use saline irrigating solution or artificial tears to rinse away mucus inside the eye. Do not put a patch over the eye.  4. Wash your hands before and after touching the infected eye. This is to prevent spreading the infection to the other eye, and to other people. Do not share your towels or washcloths with others.  5. You may use acetaminophen (Tylenol) or ibuprofen (Motrin, Advil) to control pain, unless another medicine was prescribed. [NOTE: If you have chronic liver or kidney disease or ever had a stomach ulcer or GI bleeding, talk with your doctor before using these medicines.]  6. Do not wear contact lenses until your eyes have healed and all symptoms are gone.  Follow Up  with your doctor or this facility as directed, or if there has not been improvement within 5 days.  Get Prompt Medical Attention  if any of the following occur:   Worsening vision   Increasing pain in the eye   Increasing swelling or redness of the  eyelid   Redness spreading around the eye   2000-2015 [employer] [address], [place], [place] [address]. All rights reserved. This information is not intended as a substitute for professional medical care. Always follow your healthcare professional's instructions.      Viral Respiratory Illness [Adult]  You have an Upper Respiratory Illness (URI) caused by a virus. This illness is contagious during the first few days. It is spread through the air by coughing and sneezing or by direct contact (touching the sick person and then touching your own eyes, nose or mouth). Most viral illnesses go away within 7-10 days with rest and simple home remedies. Sometimes, the illness may last for several weeks. Antibiotics will not kill a virus and are generally not prescribed for this condition.    Home Care:  1) If symptoms are severe, rest at home for the first 2-3 days. When you resume activity, don't let yourself get too tired.  2) Avoid being exposed to cigarette smoke (yours or others').  3) Tylenol (acetaminophen) or ibuprofen (Advil, Motrin) will help fever, muscle aching and headache. (Persons under 18 with fever should not take aspirin since this may cause liver damage.)  4) Your appetite may be poor, so a light diet is fine. Avoid dehydration by drinking 6-8 glasses of fluids per day (water, soft  drinks, juices, tea, soup). Extra fluids will help loosen secretions in the nose and lungs.  5) Over-the-counter cold medicines will not shorten the length of time you're sick, but they may be helpful for the following symptoms: cough (Robitussin DM); sore throat (Chloraseptic lozenges or spray); nasal and sinus congestion (Actifed, Sudafed, Chlortrimeton).  Follow Up  with your doctor or as advised if you don't improve over the next week.  Get Prompt Medical Attention  if any of the following occur:  -- Cough with lots of colored sputum (mucus) or blood in your sputum  -- Chest pain, shortness of breath,  wheezing or have trouble breathing  -- Severe headache; face, neck or ear pain  -- Fever over 100.4 F (38.0 C) for more than three days  -- You can't swallow due to throat pain   2000-2015 [employer] [address] [place], [place] [address]. All rights reserved. This information is not intended as a substitute for professional medical care. Always follow your healthcare professional's instructions.

## 2015-10-12 NOTE — Progress Notes (Signed)
URGENT  CARE  PROGRESS NOTE     Patient: Kyle Woods   Date: 10/12/2015   MRN: [id number]       Kyle Woods is a 29 y.o. male      SUBJECTIVE     Chief Complaint   Patient presents with   . Eye Problem     c/o left eye redness, discharge since yesterday.    . Cough     c/o cough, chest congestion for about 3 days. Daughter had bronchitis recently. Self treat with mucinex. Self treat with cephalixin rx [facility] for last 3 days.   he reports subjective fever, and chills. Productive coughing with clear to dark yellow sputum. Does not smoke      Eye Problem   The left eye is affected. The current episode started yesterday. The patient is experiencing no pain. There is no known exposure to pink eye. He does not wear contacts. Associated symptoms include an eye discharge, eye redness, a fever (subjective ), a foreign body sensation, itching and a recent URI. Pertinent negatives include no blurred vision, double vision, nausea, photophobia or vomiting. He has tried nothing for the symptoms. The treatment provided no relief.   Cough  Associated symptoms include eye redness and a fever (subjective ). Pertinent negatives include no shortness of breath or wheezing.       Review of Systems   Constitutional: Positive for fever (subjective ).   HENT: Negative.    Eyes: Positive for discharge and redness. Negative for blurred vision, double vision and photophobia.   Respiratory: Positive for cough. Negative for shortness of breath and wheezing.    Cardiovascular: Negative.    Gastrointestinal: Negative.  Negative for nausea and vomiting.   Genitourinary: Negative.    Skin: Positive for itching.   Allergic/Immunologic: Negative.        The following portions of the patient's history were reviewed and updated as appropriate: Allergies, Current Medications, Past Family History, Past Medical history, Past social history, Past surgical history, and Problem List.    OBJECTIVE     Vitals   Filed Vitals:     10/12/15 1058   BP: 137/82   Pulse: 82   Temp: 98 F (36.7 C)   TempSrc: Tympanic   Resp: 16   Height: 1.727 m (5\' 8" )   Weight: 90.719 kg (200 lb)       Physical Exam   Nursing note and vitals reviewed.  Constitutional: He appears well-developed and well-nourished. No distress.   HENT:   Head: Normocephalic and atraumatic.   Right Ear: External ear normal.   Left Ear: External ear normal.   Nose: Nose normal.   Mouth/Throat: Oropharynx is clear and moist. No oropharyngeal exudate.   Eyes: EOM and lids are normal. Pupils are equal, round, and reactive to light. Lids are everted and swept, no foreign bodies found. Right eye exhibits no discharge and no hordeolum. No foreign body present in the right eye. Left eye exhibits discharge. Left eye exhibits no hordeolum. No foreign body present in the left eye. Right conjunctiva is not injected. Left conjunctiva is injected.       Neck: Neck supple.   Cardiovascular: Normal rate, regular rhythm and normal heart sounds.    Pulmonary/Chest: Effort normal and breath sounds normal. No respiratory distress. He has no wheezes. He has no rales.   Abdominal: Soft.   Neurological: He is alert.   Skin: Skin is warm and dry. He is not diaphoretic.  Psychiatric: He has a normal mood and affect.       ASSESSMENT     Encounter Diagnoses   Name Primary?   . Acute bacterial conjunctivitis of left eye Yes   . Cough           PLAN     Procedures    1. Acute bacterial conjunctivitis of left eye    2. Cough   Ciloxan  Tessalon perles   Increase fluid, warm compress to left   Return to urgent care clinic for worsening symptoms   Follow up with your PCP in 3-4 days  Pt in agreement with discharge plan, all questions answered          An After Visit Summary was printed and given to the patient.      Signed,  Rexford Maus, NP  10/12/2015

## 2016-06-05 ENCOUNTER — Encounter (INDEPENDENT_AMBULATORY_CARE_PROVIDER_SITE_OTHER): Payer: Self-pay

## 2016-06-05 ENCOUNTER — Ambulatory Visit (INDEPENDENT_AMBULATORY_CARE_PROVIDER_SITE_OTHER): Payer: No Typology Code available for payment source | Admitting: Family Nurse Practitioner

## 2016-06-05 VITALS — BP 124/83 | HR 76 | Temp 98.6°F | Resp 18 | Ht 68.0 in | Wt 202.0 lb

## 2016-06-05 DIAGNOSIS — J020 Streptococcal pharyngitis: Secondary | ICD-10-CM

## 2016-06-05 DIAGNOSIS — J029 Acute pharyngitis, unspecified: Secondary | ICD-10-CM

## 2016-06-05 LAB — POCT RAPID STREP A: Rapid Strep A Screen POCT: NEGATIVE

## 2016-06-05 MED ORDER — AMOXICILLIN 500 MG PO TABS
500.0000 mg | ORAL_TABLET | Freq: Two times a day (BID) | ORAL | Status: AC
Start: 2016-06-05 — End: 2016-06-15

## 2016-06-05 MED ORDER — IBUPROFEN 800 MG PO TABS
800.0000 mg | ORAL_TABLET | Freq: Four times a day (QID) | ORAL | Status: DC | PRN
Start: 2016-06-05 — End: 2019-12-08

## 2016-06-05 NOTE — Progress Notes (Signed)
[facility]  PROGRESS NOTE     Patient: Kyle Woods   Date: 06/05/2016   MRN: [id number]       Kyle Woods is a 30 y.o. male      SUBJECTIVE     Chief Complaint   Patient presents with   . Sore Throat     Pt c/o sore throat since 06/03/16. States daughter was diagnosed with strep last week. Self treated with Dayquil and cough drops.         Sore Throat   This is a new problem. The current episode started in the past 7 days. The problem has been gradually worsening. The maximum temperature recorded prior to his arrival was 101 - 101.9 F. Associated symptoms include headaches, a hoarse voice, swollen glands and trouble swallowing. Pertinent negatives include no congestion, coughing, shortness of breath, stridor or vomiting. He has had exposure to strep. He has tried acetaminophen for the symptoms. The treatment provided moderate relief.     Some relief with tylenol for fever and sore throat  Has had strep once in past    Review of Systems   HENT: Positive for hoarse voice, sore throat and trouble swallowing. Negative for congestion.    Respiratory: Negative for cough, shortness of breath and stridor.    Gastrointestinal: Negative for vomiting.   Neurological: Positive for headaches.   All other systems reviewed and are negative.      The following portions of the patient's history were reviewed and updated as appropriate: Allergies, Current Medications, Past Family History, Past Medical history, Past social history, Past surgical history, and Problem List.    OBJECTIVE     Vitals   Filed Vitals:    06/05/16 1102   BP: 124/83   Pulse: 76   Temp: 98.6 F (37 C)   TempSrc: Oral   Resp: 18   Height: 1.727 m (5\' 8" )   Weight: 91.627 kg (202 lb)       Physical Exam   Nursing note and vitals reviewed.  Constitutional: He is oriented to person, place, and time. He appears well-developed and well-nourished.   HENT:   Head: Normocephalic.   Right Ear: A middle ear effusion is present.   Left Ear: A middle  ear effusion is present.   Nose: Mucosal edema present.   Mouth/Throat: Posterior oropharyngeal erythema present.   Tonsils enlarged, +2 with erythema   Eyes: Conjunctivae are normal.   Neck: Normal range of motion. Neck supple.   Cardiovascular: Normal rate, regular rhythm and normal heart sounds.    Pulmonary/Chest: Effort normal and breath sounds normal. No respiratory distress. He has no wheezes. He has no rales.   Musculoskeletal: Normal range of motion.   Lymphadenopathy:     He has cervical adenopathy.   Neurological: He is alert and oriented to person, place, and time.   Skin: Skin is warm.   Psychiatric: He has a normal mood and affect.       Lab Results (24 Hour)   Results     Procedure Component Value Units Date/Time    Rapid Group A Strep [[id number]]  (Normal) Collected:  06/05/16 1141    Specimen Information:  Throat Updated:  06/05/16 1141     POCT QC Pass      Rapid Strep A Screen POCT Negative       Comment        Result:        Negative Results should be  confirmed by throat Cx to confirm absence of Strep A inf.          Radiology Results (24 Hour)     ** No results found for the last 24 hours. **          ASSESSMENT     Encounter Diagnoses   Name Primary?   . Throat soreness    . Pharyngitis due to Streptococcus species Yes     Will tx for strep, given that it looks like strep and pt has been exposed to strep       PLAN     Procedures    1. Throat soreness  - Rapid Group A Strep    2. Pharyngitis due to Streptococcus species  - ibuprofen (ADVIL,MOTRIN) 800 MG tablet; Take 1 tablet (800 mg total) by mouth every 6 (six) hours as needed for Pain.  Dispense: 30 tablet; Refill: 0  - amoxicillin (AMOXIL) 500 MG tablet; Take 1 tablet (500 mg total) by mouth 2 (two) times daily.  Dispense: 20 tablet; Refill: 0      An After Visit Summary was printed and given to the patient.      Signed,  Nani Skillern, FNP  06/05/2016

## 2016-06-05 NOTE — Patient Instructions (Signed)
Take medications as directed with food  Drink plenty of fluids  Antibiotics:  Take with food  Start yogurt with live culture or probiotics for healthy gut  Take ibuprofen/acetametaphen for pain/fever as directed.  You are contagious until on antibiotics for 24 hours  Change toothbrush after 48 hours on antibiotic  Follow up with PCP or UC if symptoms do not improve in 72 hours                                Pharyngitis: Strep (Presumed)    You have pharyngitis (sore throat). The cause is thought to be thestreptococcus, or strep,bacterium.Strep throat infectioncan cause throat pain that is worse when swallowing, aching all over, headache,and fever. The infection may be spread by coughing, kissing,or touching others after touching your mouth or nose. Antibiotic medications are given to treat the infection.  Home care   Rest at home. Drink plenty of fluids to avoid dehydration.   No work or school for the first 2 days of taking the antibiotics. After this time, you will not be contagious. You can then return to work or school if you are feeling better.   The antibiotic medication must be taken for the full 10 days, even if you feel better. This is very important to ensure the infection is treated. It is also important to prevent drug-resistant organisms from developing.If you were given an antibiotic shot, no more antibiotics are needed.   You may use acetaminophen or ibuprofen to control pain or fever, unless another medicine was prescribed for this. If you have chronic liver or kidney disease or ever had a stomach ulcer or GI bleeding, talk with your doctor before using these medicines.   Throat lozenges or a throat-numbing sprays can help reduce throat pain. Gargling with warm salt water can also help. Dissolve 1/2 teaspoon of salt in 1 8 ounce glass of warm water.   Avoid salty or spicy foods, which can irritate the throat.  Follow-up care  Follow up with your healthcare provider or our staff if you  are not improving over the next week.  When to seek medical advice  Call your healthcare provider right away if any of these occur:   Feveras directed by your doctor.   New or worsening ear pain, sinus pain, or headache   Painful lumps in the back of neck   Stiff neck   Lymph nodes are getting larger   Inability to swallow liquids, excessive drooling,or inability to open mouth wide due to throat pain   Signs of dehydration (very dark urine or no urine, sunken eyes, dizziness)   Trouble breathing or noisy breathing   Muffled voice   New rash  Date Last Reviewed: 02/27/2014   2000-2016 [employer] [address] [place], [place] [address]. All rights reserved. This information is not intended as a substitute for professional medical care. Always follow your healthcare professional's instructions.

## 2016-07-09 ENCOUNTER — Other Ambulatory Visit (INDEPENDENT_AMBULATORY_CARE_PROVIDER_SITE_OTHER): Payer: Self-pay | Admitting: Family

## 2016-07-09 ENCOUNTER — Ambulatory Visit (INDEPENDENT_AMBULATORY_CARE_PROVIDER_SITE_OTHER): Payer: No Typology Code available for payment source

## 2016-07-09 DIAGNOSIS — M25562 Pain in left knee: Secondary | ICD-10-CM

## 2018-10-20 ENCOUNTER — Ambulatory Visit: Payer: Self-pay | Admitting: Podiatry

## 2019-12-06 ENCOUNTER — Encounter (HOSPITAL_BASED_OUTPATIENT_CLINIC_OR_DEPARTMENT_OTHER): Payer: Self-pay

## 2019-12-06 NOTE — Progress Notes (Signed)
[facility] Behavioral Health Prescreen     Date/Time: 1:10 PM  Interviewer: Conan Bowens  Patients Name (Always update Registration for Patient): Kyle Woods      Please note that [facility] is a [facility] that provides same day or next day psychiatric assessment appointments. However, Due to the current pandemic, walk-in services are on hold and they are scheduling appointments. Appointments are either via MyChart or In Person [facility]. [facility] does not prescribe controlled medications such as stimulants, benzo's or opiates and they also do not provide ongoing psychiatric care.  Would you like to continue with the prescreen for [facility] scheduling: yes    It is a requirement that you are within the [place] for all [facility] Telemedicine visit. Do you have any concerns about your ability to do this? no  (If Patient resides outside of [place], to have to be scheduled to come in person)    Are you seeking behavioral health services due to present legal issue? no  Note: REFER OUT IF APPROPRIATE      Do you require Hard of Hearing Services? no  Do you require Language Services? no      Are you currently experiencing fever, cough or shortness of breath?  no   Are you currently experiencing chills, sore throat, new headache, loss of taste or smell, or body aches not attributable to physical activity?  no   Have you had close contact with a COVID-19 patient?  no   Have you recently been tested for COVID-19?  no   Have you ever been diagnosed with COVID-19?  no   Do you live in a group living residence, such as an assisted living facility, nursing home, shelter or dormitory?  no     Who is referring you for Services: [facility]   (Eg: Doctor, ED, SELF, Insurance)    SCREENING:    Reason for Referral: Pt reports "the [facility] referred you and you are currently taking anti-anxiety medication that best works for me. I am currently out of my medication and need to see someone"     Are you presently receiving  behavioral health services from an outpatient provider?  no  If currently seeing a psychiatric prescriber, why are you seeking services at [facility]?   Note: [facility] does not provide second opinion.     Have you had suicide thought within the last month? no  If YES what were the circumstances?   When was the last time within the 30 days that you felt this way?   Are you presently experiencing Suicidal Ideation with a plan or intent?   Note: Note: REFER OUT IF APPROPRIATE      Are you currently experiencing Homicidal ideation with a plan or intent: no  Note: Note: REFER OUT IF APPROPRIATE          Are you currently using alcohol or drugs? no  If YES please complete for all reported alcohol or drugs  Drug:   Pattern (Route, Amount) of use:   Date of last Use:   Note: REFER OUT IF APPROPRIATE      Additional Comment:    Reminders:   To cancel or reschedule please call us as soon as possible.    Please have the most up to date list/with dosages of your medication for your appointment & pharmacy information   Please note that all visits occur via MyChart or In person [facility]    Psychiatry   If a Pt is  requesting non-clinical documentation (i.e. Disability evaluation), please refer to the following information:   Our mental health services are therapeutic in nature and assessment is limited to clinical evaluation and treatment.  Assessments that do not fall under [facility] [facility]'s scope of services include:  Court-ordered compliance   Evidence of disability   Court mandated services resulting in the provider testifying on your behalf  Expert witnesses in court cases  Impressions for or fitness for custody   Evaluations for emotional support animals  Disability or FMLA paperwork  Psychological testing (includes ADHD testing and dementia evaluation)

## 2019-12-08 ENCOUNTER — Encounter (HOSPITAL_BASED_OUTPATIENT_CLINIC_OR_DEPARTMENT_OTHER): Payer: Self-pay | Admitting: Psychiatric - Mental Health Nurse Practitioner (Across the Lifespan)

## 2019-12-08 ENCOUNTER — Telehealth (INDEPENDENT_AMBULATORY_CARE_PROVIDER_SITE_OTHER): Payer: 59 | Admitting: Psychiatric - Mental Health Nurse Practitioner (Across the Lifespan)

## 2019-12-08 DIAGNOSIS — F209 Schizophrenia, unspecified: Secondary | ICD-10-CM

## 2019-12-08 DIAGNOSIS — F418 Other specified anxiety disorders: Secondary | ICD-10-CM | POA: Insufficient documentation

## 2019-12-08 DIAGNOSIS — F99 Mental disorder, not otherwise specified: Secondary | ICD-10-CM

## 2019-12-08 DIAGNOSIS — F32A Depression, unspecified: Secondary | ICD-10-CM | POA: Insufficient documentation

## 2019-12-08 DIAGNOSIS — F5105 Insomnia due to other mental disorder: Secondary | ICD-10-CM

## 2019-12-08 DIAGNOSIS — F329 Major depressive disorder, single episode, unspecified: Secondary | ICD-10-CM

## 2019-12-08 MED ORDER — DIPHENHYDRAMINE HCL (SLEEP) 25 MG PO TABS
25.0000 mg | ORAL_TABLET | Freq: Every evening | ORAL | 1 refills | Status: AC | PRN
Start: 2019-12-08 — End: 2020-02-06

## 2019-12-08 MED ORDER — ARIPIPRAZOLE 10 MG PO TABS
ORAL_TABLET | ORAL | 0 refills | Status: AC
Start: 2019-12-08 — End: ?

## 2019-12-08 MED ORDER — SERTRALINE HCL 50 MG PO TABS
50.0000 mg | ORAL_TABLET | Freq: Every day | ORAL | 2 refills | Status: AC
Start: 2019-12-08 — End: 2020-03-07

## 2019-12-08 NOTE — Progress Notes (Signed)
[facility] Behavioral Health Psychiatric Evaluation    Date/Time:   12/08/2019  5:23 PM  Name:  Kyle Woods, Kyle Woods  MRN:    [id number]  Age:   34 y.o.  DOB:   Mar 22, 1986  Sex:  male     Originating site: Sitting in the back seat of a [employer] civil engineer in [place], [place] at the intersection of [address].      Distant site:  Provider's Home Office    Verbal consent has been obtained from the patient to conduct a video/phone conference Licensed conveyancer) visit encounter to minimize exposure to COVID-19: Yes      CSSRS Questions:          C-SSRS Suicidal Ideation Severity    Ask questions that are in italics for the past month. yes no   1)    Wish to be dead  []    [x]        In the past month, Have you wished you were dead or wished you could go to sleep and not wake up?     2)   Current suicidal thoughts  []    [x]        In the past month, Have you actually had any thoughts of killing yourself?     IF YES TO 2, ASK 3, 4,5. IF NO TO 2, GO TO QUESTION 6.         3)   Suicidal thoughts w/ Method (w/no specific Plan or Intent or act)  []   []        In the past month, Have you been thinking about how you might do this?        (I.e. thoughts to overdose; etc.)          4)    Suicidal Intent without Specific Plan  []    []        In the past month, Have you had these thoughts and had some intention of acting on them?    (I.e. having thoughts to overdose with some intent to act on these thoughts)           5)    Intent with Plan  []    []        In the past month, Have you started to work out or worked out the details of how to kill yourself? Do you intend to carry out this plan?    (I.e. plan to overdose on Tuesday with intent to do so on Tuesday vs. General thought for Tuesday)        6) C-SSRS Suicidal Behavior: "Have you ever done anything, started to do anything, or prepared to do anything to end your life? Lifetime Lifetime      []    [x]       3-2 months ago  1 month ago     If YES Was it within the past 3 months?  []   []    []      [ IF ORANGE OR RED, COMPLETE [facility] STEP 3 IN THIS SPACE. ]       CHIEF COMPLAINT  Depressed mood and AH    HISTORY OF PRESENT ILLNESS  34 y/o AAM pt with a reported PPHx of Schizophrenia, Anxiety d/o, Depressive d/o, and Substance Abuse, presenting with complaint of depressed mood and AH.  Pt reports being released from jail in Oct 2020 after being incarcerated for two years on a drug charge.  He says he was unable to receive treatment for his mental illness  during that time due to "short staffing".  Pt says upon his release he made contact with the [facility] but was unable to make appointments.  He says he is now on a wait-list and was told he should be getting a call soon.  He says [facility] referred him to [facility]     Pt reports he was diagnosed with Schizophrenia, depression, anxiety in 2001.  He says, at that time, he presented with depressed mood, hearing voices, and paranoid thoughts.  Pt says he was prescribed Risperidone, Ativan, and Zoloft.  Pt says he was on and off medication for years.  He did not like the Risperidone because it caused fatigue, and "cramping" inside his mouth.  In 2015 pt says he was going through a deep depression and  was started on Wellbutrin.  He says it was helpful.  Pt says he eventually started self-medicating with cocaine and heroin.      Current mood described as "depressed sometimes".  Pt says he hears voices sometimes.  Voices telling him to hurt people.  He denies having a target or a plan or intent to harm anyone.  He thinks people are after him.  He says he sometimes thinks he is in control of his thoughts, stating "but then it gets to be a bit much".  Sleep "not much".  Sleeping with the lights on.  Sleeping about 4 hours/night.  Energy level decreased.  Appetite decreased.  Unemployed.  Unable to focus and concentrate.  Watching t.v. most of the time.  Isolated.  Anxiety "all the time".  He says when anxious he is "jittery.Marland KitchenMarland KitchenI have tingling in my fingers".  He feels  confined when wearing certain clothes and while wearing a seat belt.  Pt says too much heat or a dark room.will trigger a panic attack.  Panic attacks characterized by rapid heart beat and SOB.  Pt denies SI/HI/SIB.        PSYCHIATRIC REVIEW OF SYMPTOMS  Delusions:  paranoid thoughts   Hallucinations: Auditory: hearing voices that tell him to hurt people; pt denies plan or inent   Suicide or Self-Injury?: No   Homicide or Violence?: No     Past Psychiatric History  Psychiatry - Yes.  Last routine visit was in 2016  Psychotherapy - Denies  Hospitalization - x2.  [facility] in [place] around 2001.  [facility]  Suicide Attempt - Denies  SIB - Denies  Past Medication Trials - Risperidone, Ativan, Zoloft, Wellbutrin     Social History  Single, with four children; 11th grade education; Worked briefly for two traffic control companies.  Currently on SSI.       Substance Abuse History  Pt reports past use of Marijuana, Cocaine, and Heroine        Family History   Problem Relation Age of Onset    Bipolar disorder Mother     No known problems Father          MEDICAL HISTORY    Current/Home Medications    No medications on file       Past Medical History:   Diagnosis Date    Bipolar affective disorder     Depression     Psychosis        History reviewed. No pertinent surgical history.    No Known Allergies       PSYCHIATRIC SPECIALITY & MENTAL STATUS EXAM  Vital Signs There were no vitals taken for this visit.   General Appearance Neatly  groomed, appropriately dressed and adequately nourished   Muskuloskeletal No weakness, abnormal movements, or other impairments   Gait/Station  No impairments to gait/station   Speech Normal Rate, Rythym, & Volume   Thought Process Logical, Linear, Goal Directed   Associations Intact    Thought Content No evidence of homicidal, suicidal, violent, or delusional thought content   Perceptions Auditory: Pt reports CAH telling him to harm others.  No  observation of pt responding to internal stimuli    Judgment Could be compromised by psychosis   Insight  Good   LOC/Orientation A&O x 4, Sensorium Clear   Memory Intact   Attention & Concentration Normal   Fund of Knowledge Adequate given patient age, socioeconomic status, and educational level   Language Fluent with no impairments in comprehension or expression   Mood Depressed   Affect Limited / Constricted       ASSESSMENT  34 y.o. AAM pt with a reported PPHx of Schizophrenia, Anxiety d/o, Depressive d/o, and Substance Abuse, presenting with complaint of depressed mood and AH.  Pt presents with depressed and anxious mood.  Affect constricted.  He appeared very calm.  Thoughts were logical and linear.  No objective evidence of active psychosis (no self-talk or laughter, no appearance of hypervigilance...).  Pt recently incarcerated for two years on drug charges.  Released in Oct 2020.  He says he received no medication therapy while in jail.      Psychosis - Pt reports long-standing history of AH and paranoid thoughts beginning in 2001.  He reports current AH, saying he hears multiple voices, some of which tell him to hurt other people.  Pt denies having a target or any intent or plan to harm anyone.  He reports paranoid thoughts, believing others are out to harm him.  Sleeping with the lights on.  Pt says thoughts cause him to isolate.      Depression - Low mood, decreased sleep, decreased energy, decreased appetite, and difficulty with focus and concentration.  Pt denies SI or SIB.      Anxiety - Anxiety "all the time".  He says when anxious he is "jittery.Marland KitchenMarland KitchenI have tingling in my fingers".  He feels confined when wearing certain clothes and while wearing a seat belt.  Pt says too much heat or a dark room.will trigger a panic attack.  Panic attacks characterized by rapid heart beat and SOB.        Encounter Diagnoses   Name Primary?    Schizophrenia, unspecified type Yes    Depression, unspecified depression  type     Insomnia due to other mental disorder     Anxiety with limited-symptom attacks        PLAN  Treatment options and alternatives reviewed with patient, along with detailed discussion of medication(s) and side effects, and they concur with following plan:    Pt says he did not like Risperidone as it caused him to be fatigued with a cramping sensation inside of his mouth.  Pt open to an alternative therapy. He will be given a two week supply of SGA until labs received and reviewed. Pt agreed to have labs drawn at the nearest [facility] in [place], [place].   Pt reports that Sertraline was effective for treating his depressed and anxious mood in the past.      Medications:  In lieu of written consent, pt gave verbal consent to order psychopharmacology agents as listed below:     Abilify 10 mg, 1/2 tab, po, daily  x 7 days.  If tolerated increase to 1 tab, po, daily for mood and psychosis   Sertraline 50 mg, 1 tab, po, daily for anxious and depressed mood   Diphenhydramine 25 mg, 1 tab, po, qhs, prn, insomnia    Therapies:   Psychotherapy: [facility] to arrange thru their Insurance Panel, [facility], or other external referral and Pt says he is on the [employer] wait list   PHP: PHP not clinically relevant at this time    Labs/Other:   CBC, CMP, Lipid Panel, TSH    DISPOSITION & FOLLOW-UP   Discharge to: Self-care   Follow-up: Pt to f/u with CSB for outpatient services.  He says he is currently on a wait list.     _____________________________________________  Thurmond Butts, NP

## 2019-12-08 NOTE — Patient Instructions (Signed)
MEDICATION INSTRUCTIONS                    1. Take all medications as prescribed; do not stop medications or change dosages without talking to your provider(s).  2. Abstain from alcohol and/or illegal drugs as they interfere with psychiatric medications.   3. Immediately go to the nearest emergency department or call 911 if you have any thoughts of wanting to harm yourself or others, or for any other crisis.  4. Consult with your pharmacist if you questions about your medications, their side effects or possible interactions with other medications you take.      FOLLOW-UP CARE APPOINTMENTS    1. PSYCHIATRIC MEDICATION MANAGEMENT: Call [facility] ( [phone]) if you should experience intolerable medication side effects.  2. PSYCHOTHERAPY: If you do not already have a therapist, find one by contact your insurance provider for a list of in-network providers. Time-limited, psychotherapy programs are available through [facility] by calling [phone]. Another source for therapists is http://www.psychology-today.com/.  3. Return to [facility] anytime if necessary (but not for routine matters like refills). [facility] Walk-in hours are 8a-4pm Monday thru Friday, excluding Major Holidays. We close early at 3pm on the first Wednesday of each month for staff meetings.      ABOUT YOUR MEDICATIONS:      Aripiprazole tablets  Brand Name: Abilify  What is this medicine?  ARIPIPRAZOLE (ay ri PIP ray zole) is an atypical antipsychotic. It is used to treat schizophrenia and bipolar disorder, also known as manic-depression. It is also used to treat Tourette's disorder and some symptoms of autism. This medicine may also be used in combination with antidepressants to treat major depressive disorder.  How should I use this medicine?  Take this medicine by mouth with a glass of water. Follow the directions on the prescription label. You can take this medicine with or without food. Take your doses at regular intervals. Do not take your medicine more often  than directed. Do not stop taking except on the advice of your doctor or health care professional.  A special MedGuide will be given to you by the pharmacist with each prescription and refill. Be sure to read this information carefully each time.  Talk to your pediatrician regarding the use of this medicine in children. While this drug may be prescribed for children as young as 82 years of age for selected conditions, precautions do apply.  What side effects may I notice from receiving this medicine?  Side effects that you should report to your doctor or health care professional as soon as possible:   allergic reactions like skin rash, itching or hives, swelling of the face, lips, or tongue   breathing problems   confusion   fast, irregular heartbeat   fever or chills, sore throat   inability to keep still   males: prolonged or painful erection   new or increased gambling urges, sexual urges, uncontrolled spending, binge or compulsive eating, or other urges   problems with balance, talking, walking   seizures   signs and symptoms of high blood sugar such as being more thirsty or hungry or having to urinate more than normal. You may also feel very tired or have blurry vision   signs and symptoms of low blood pressure like dizziness; feeling faint or lightheaded, falls; unusually weak or tired   signs and symptoms of neuroleptic malignant syndrome like confusion; fast or irregular heartbeat; high fever; increased sweating; stiff muscles   sudden numbness or  weakness of the face, arm, or leg   suicidal thoughts or other mood changes   trouble swallowing   uncontrollable movements of the arms, face, head, mouth, neck, or upper body  Side effects that usually do not require medical attention (report to your doctor or health care professional if they continue or are bothersome):   constipation   headache   nausea, vomiting   trouble sleeping   weight gain  What may interact with this medicine?  Do not  take this medicine with any of the following medications:   brexpiprazole   cisapride   dronedarone   metoclopramide   pimozide   thioridazine  This medicine may also interact with the following medications:   alcohol   carbamazepine   certain medicines for anxiety or sleep   certain medicines for blood pressure   certain medicines for fungal infections like ketoconazole, fluconazole, posaconazole, and itraconazole   clarithromycin   dofetilide   fluoxetine   other medicines that prolong the QT interval (cause an abnormal heart rhythm)   paroxetine   quinidine   rifampin   ziprasidone  What if I miss a dose?  If you miss a dose, take it as soon as you can. If it is almost time for your next dose, take only that dose. Do not take double or extra doses.  Where should I keep my medicine?  Keep out of the reach of children.  Store at room temperature between 15 and 30 degrees C (59 and 86 degrees F). Throw away any unused medicine after the expiration date.  What should I tell my health care provider before I take this medicine?  They need to know if you have any of these conditions:   dehydration   dementia   diabetes   heart disease   history of stroke   low blood counts, like low white cell, platelet, or red cell counts   Parkinson's disease   seizures   suicidal thoughts, plans, or attempt; a previous suicide attempt by you or a family member   an unusual or allergic reaction to aripiprazole, other medicines, foods, dyes, or preservatives   pregnant or trying to get pregnant   breast-feeding  What should I watch for while using this medicine?  Visit your doctor or health care professional for regular checks on your progress. It may be several weeks before you see the full effects of this medicine. Do not suddenly stop taking this medicine. You may need to gradually reduce the dose.  Patients and their families should watch out for worsening depression or thoughts of suicide. Also watch  out for sudden changes in feelings such as feeling anxious, agitated, panicky, irritable, hostile, aggressive, impulsive, severely restless, overly excited and hyperactive, or not being able to sleep. If this happens, especially at the beginning of antidepressant treatment or after a change in dose, call your health care professional.  Kyle Woods may get dizzy or drowsy. Do not drive, use machinery, or do anything that needs mental alertness until you know how this medicine affects you. Do not stand or sit up quickly, especially if you are an older patient. This reduces the risk of dizzy or fainting spells. Alcohol can increase dizziness and drowsiness. Avoid alcoholic drinks.  This medicine can reduce the response of your body to heat or cold. Dress warmly in cold weather and stay hydrated in hot weather. If possible, avoid extreme temperatures like saunas, hot tubs, very hot or cold showers, or activities  that can cause dehydration such as vigorous exercise.  This medicine may cause dry eyes and blurred vision. If you wear contact lenses, you may feel some discomfort. Lubricating drops may help. See your eye doctor if the problem does not go away or is severe.  This medicine may increase blood sugar. Ask your healthcare provider if changes in diet or medicines are needed if you have diabetes.  There have been reports of uncontrollable and strong urges to gamble, binge eat, shop, and have sex while taking this medicine. If you experience any of these or other uncontrollable and strong urges while taking this medicine, you should report it to your health care provider as soon as possible.  NOTE:This sheet is a summary. It may not cover all possible information. If you have questions about this medicine, talk to your doctor, pharmacist, or health care provider. Copyright 2020 Elsevier      Sertraline tablets  Brand Name: Zoloft  What is this medicine?  SERTRALINE (SER tra leen) is used to treat depression. It may also be  used to treat obsessive compulsive disorder, panic disorder, post-trauma stress, premenstrual dysphoric disorder (PMDD) or social anxiety.  How should I use this medicine?  Take this medicine by mouth with a glass of water. Follow the directions on the prescription label. You can take it with or without food. Take your medicine at regular intervals. Do not take your medicine more often than directed. Do not stop taking this medicine suddenly except upon the advice of your doctor. Stopping this medicine too quickly may cause serious side effects or your condition may worsen.  A special MedGuide will be given to you by the pharmacist with each prescription and refill. Be sure to read this information carefully each time.  Talk to your pediatrician regarding the use of this medicine in children. While this drug may be prescribed for children as young as 7 years for selected conditions, precautions do apply.  What side effects may I notice from receiving this medicine?  Side effects that you should report to your doctor or health care professional as soon as possible:   allergic reactions like skin rash, itching or hives, swelling of the face, lips, or tongue   anxious   black, tarry stools   changes in vision   confusion   elevated mood, decreased need for sleep, racing thoughts, impulsive behavior   eye pain   fast, irregular heartbeat   feeling faint or lightheaded, falls   feeling agitated, angry, or irritable   hallucination, loss of contact with reality   loss of balance or coordination   loss of memory   painful or prolonged erections   restlessness, pacing, inability to keep still   seizures   stiff muscles   suicidal thoughts or other mood changes   trouble sleeping   unusual bleeding or bruising   unusually weak or tired   vomiting  Side effects that usually do not require medical attention (report to your doctor or health care professional if they continue or are bothersome):   change  in appetite or weight   change in sex drive or performance   diarrhea   increased sweating   indigestion, nausea   tremors  What may interact with this medicine?  Do not take this medicine with any of the following medications:   cisapride   dofetilide   dronedarone   linezolid   MAOIs like Carbex, Eldepryl, Marplan, Nardil, and Parnate   methylene blue (injected  into a vein)   pimozide   thioridazine  This medicine may also interact with the following medications:   alcohol   amphetamines   aspirin and aspirin-like medicines   certain medicines for depression, anxiety, or psychotic disturbances   certain medicines for fungal infections like ketoconazole, fluconazole, posaconazole, and itraconazole   certain medicines for irregular heart beat like flecainide, quinidine, propafenone   certain medicines for migraine headaches like almotriptan, eletriptan, frovatriptan, naratriptan, rizatriptan, sumatriptan, zolmitriptan   certain medicines for sleep   certain medicines for seizures like carbamazepine, valproic acid, phenytoin   certain medicines that treat or prevent blood clots like warfarin, enoxaparin, dalteparin   cimetidine   digoxin   diuretics   fentanyl   isoniazid   lithium   NSAIDs, medicines for pain and inflammation, like ibuprofen or naproxen   other medicines that prolong the QT interval (cause an abnormal heart rhythm)   rasagiline   safinamide   supplements like St. John's wort, kava kava, valerian   tolbutamide   tramadol   tryptophan  What if I miss a dose?  If you miss a dose, take it as soon as you can. If it is almost time for your next dose, take only that dose. Do not take double or extra doses.  Where should I keep my medicine?  Keep out of the reach of children.  Store at room temperature between 15 and 30 degrees C (59 and 86 degrees F). Throw away any unused medicine after the expiration date.  What should I tell my health care provider before I take this  medicine?  They need to know if you have any of these conditions:   bleeding disorders   bipolar disorder or a family history of bipolar disorder   glaucoma   heart disease   high blood pressure   history of irregular heartbeat   history of low levels of calcium, magnesium, or potassium in the blood   if you often drink alcohol   liver disease   receiving electroconvulsive therapy   seizures   suicidal thoughts, plans, or attempt; a previous suicide attempt by you or a family member   take medicines that treat or prevent blood clots   thyroid disease   an unusual or allergic reaction to sertraline, other medicines, foods, dyes, or preservatives   pregnant or trying to get pregnant   breast-feeding  What should I watch for while using this medicine?  Tell your doctor if your symptoms do not get better or if they get worse. Visit your doctor or health care professional for regular checks on your progress. Because it may take several weeks to see the full effects of this medicine, it is important to continue your treatment as prescribed by your doctor.  Patients and their families should watch out for new or worsening thoughts of suicide or depression. Also watch out for sudden changes in feelings such as feeling anxious, agitated, panicky, irritable, hostile, aggressive, impulsive, severely restless, overly excited and hyperactive, or not being able to sleep. If this happens, especially at the beginning of treatment or after a change in dose, call your health care professional.  Kyle Woods may get drowsy or dizzy. Do not drive, use machinery, or do anything that needs mental alertness until you know how this medicine affects you. Do not stand or sit up quickly, especially if you are an older patient. This reduces the risk of dizzy or fainting spells. Alcohol may interfere with the effect of  this medicine. Avoid alcoholic drinks.  Your mouth may get dry. Chewing sugarless gum or sucking hard candy, and  drinking plenty of water may help. Contact your doctor if the problem does not go away or is severe.  NOTE:This sheet is a summary. It may not cover all possible information. If you have questions about this medicine, talk to your doctor, pharmacist, or health care provider. Copyright 2020 Elsevier      Diphenhydramine capsules or tablets [Insomnia]  Brand Names: Aid to Sleep, Nytol, Simply Sleep, Sleep Tabs, Sominex, Vicks Qlearquil Nighttime Allergy Relief, Vicks ZzzQuil Nightime Sleep-Aid  What is this medicine?  DIPHENHYDRAMINE (dye fen HYE dra meen) is an antihistamine. This medicine is used to treat occasional sleeplesness.  How should I use this medicine?  Take this medicine by mouth with a full glass of water. Follow the directions on the prescription label. Do not take your medicine more often than directed.  Talk to your pediatrician regarding the use of this medicine in children. While this drug may be prescribed for children as young as 92 years old for selected conditions, precautions do apply.  Patients over 78 years old may have a stronger reaction and need a smaller dose.  What side effects may I notice from receiving this medicine?  Side effects that you should report to your doctor or health care professional as soon as possible:   allergic reactions like skin rash, itching or hives, swelling of the face, lips, or tongue   changes in vision   confused, agitated, or nervous   fast, irregular heartbeat   tremor   trouble passing urine or change in the amount of urine   unusual bleeding or bruising   unusually weak or tired  Side effects that usually do not require medical attention (report to your doctor or health care professional if they continue or are bothersome):   constipation, diarrhea   drowsy   headache   loss of appetite   stomach upset, vomiting   thick mucus  What may interact with this medicine?  Do not take this medicine with any of the following medications:   MAOIs  like Carbex, Eldepryl, Marplan, Nardil, and Parnate  This medicine may also interact with the following medications:   alcohol   barbiturates like phenobarbital   medicines for bladder spasm like oxybutynin, tolterodine   medicines for blood pressure   medicines for depression, anxiety, or psychotic disturbances   medicines for movement abnormalities or Parkinson's disease   medicines for sleep   other medicines for cold, cough, or allergy   some medicines for the stomach like chlordiazepoxide, dicyclomine  What if I miss a dose?  If you miss a dose, take it as soon as you can. If it is almost time for your next dose, take only that dose. Do not take double or extra doses.  Where should I keep my medicine?  Keep out of the reach of children.  Store at room temperature between 20 and 25 degrees C (68 and 77 degrees F). Keep container closed tightly. Throw away any unused medicine after the expiration date.  What should I tell my health care provider before I take this medicine?  They need to know if you have any of these conditions:   glaucoma   high blood pressure or heart disease   liver disease   lung or breathing disease, like asthma   pain or trouble passing urine   prostate trouble   ulcers or other  stomach problems   an unusual or allergic reaction to diphenhydramine, other medicines foods, dyes, or preservatives such as sulfites   pregnant or trying to get pregnant   breast-feeding  What should I watch for while using this medicine?  Visit your doctor or health care professional for regular check ups. Tell your doctor or health care professional if your symptoms do not start to get better or if they get worse.  Your mouth may get dry. Chewing sugarless gum or sucking hard candy, and drinking plenty of water may help. Contact your doctor if the problem does not go away or is severe.  This medicine may cause dry eyes and blurred vision. If you wear contact lenses you may feel some discomfort.  Lubricating drops may help. See your eye doctor if the problem does not go away or is severe.  You may get drowsy or dizzy. Do not drive, use machinery, or do anything that needs mental alertness until you know how this medicine affects you. Do not stand or sit up quickly, especially if you are an older patient. This reduces the risk of dizzy or fainting spells. Alcohol may interfere with the effect of this medicine. Avoid alcoholic drinks.  NOTE:This sheet is a summary. It may not cover all possible information. If you have questions about this medicine, talk to your doctor, pharmacist, or health care provider. Copyright 2020 Elsevier                        [facility] RESOURCES    [facility] Call Center  - [phone] 24/7/365  For admissions and screening for all [facility] Services, including:   Comprehensive Addiction Treatment Services (CATS) Inpatient Detox, IOP Intensive Outpatient Programs   Partial Hospitalization Program (PHP), Outpatient psychiatry, Outpatient counseling        [facility]  [facility]  [address]  [place], [place] [address] For Urgent Adult (18 and Over) Psychiatric Assessments [phone]     [facility]  [address]  [place], [place] [address]   For Child and Youth (Under 18) Mental Health and Substance Abuse Outpatient Services [phone]   [facility] [place]  [phone]  [place], [place] [address] For Non-Urgent Psychiatric Appointments: [phone]   [facility]-   [facility]  [address]  [place], [place] [address]   For Non-Urgent Psychiatric Appointments: [phone]   [facility] [place]  [address] [place], [place] [address]   For Non-Urgent Psychiatric Appointments: [phone]   [facility]- [address]  [address]   [place], [place] [address]   For Non-Urgent Psychiatric Appointments: [phone]   [facility] [place]  [address]   [place], [place] [address]   For Non-Urgent Psychiatric Appointments: [phone]         COMMUNITY RESOURCES (MENTAL HEALTH CENTERS):      [facility]  Entry and Referral Services [phone]     [facility], [place], [place] [phone]   [place], [place], [place] [phone]     [place], [place], [place] [phone]     [facility], [place], [place] [phone]     [facility], [place],  [place]  [phone]       [place]    [facility], [place], [place] [phone]     [place]    [facility], [place], [place]  [phone]       [place]      [facility] [place] [facility] [place]  ([place]) - [phone]              [place] - [phone]

## 2019-12-09 ENCOUNTER — Encounter (HOSPITAL_BASED_OUTPATIENT_CLINIC_OR_DEPARTMENT_OTHER): Payer: Self-pay | Admitting: Psychiatric - Mental Health Nurse Practitioner (Across the Lifespan)

## 2019-12-09 DIAGNOSIS — F418 Other specified anxiety disorders: Secondary | ICD-10-CM

## 2019-12-09 DIAGNOSIS — F209 Schizophrenia, unspecified: Secondary | ICD-10-CM

## 2019-12-09 DIAGNOSIS — F32A Depression, unspecified: Secondary | ICD-10-CM

## 2021-04-19 ENCOUNTER — Ambulatory Visit (HOSPITAL_COMMUNITY)
Admission: EM | Admit: 2021-04-19 | Discharge: 2021-04-19 | Disposition: A | Discharge status: Home / Self Care | Payer: No Typology Code available for payment source | Attending: Medical Oncology | Admitting: Medical Oncology

## 2021-04-19 ENCOUNTER — Other Ambulatory Visit: Payer: Self-pay

## 2021-04-19 ENCOUNTER — Encounter (HOSPITAL_COMMUNITY): Payer: Self-pay

## 2021-04-19 ENCOUNTER — Ambulatory Visit (INDEPENDENT_AMBULATORY_CARE_PROVIDER_SITE_OTHER): Payer: No Typology Code available for payment source

## 2021-04-19 DIAGNOSIS — M79644 Pain in right finger(s): Secondary | ICD-10-CM | POA: Diagnosis not present

## 2021-04-19 DIAGNOSIS — M25531 Pain in right wrist: Secondary | ICD-10-CM | POA: Diagnosis not present

## 2021-04-19 NOTE — ED Provider Notes (Signed)
[facility]    CSN: [id number] Arrival date & time: 04/19/21  1729      History   Chief Complaint Chief Complaint  Patient presents with  . thumb swelling    HPI WARD BOISSONNEAULT is a 35 y.o. male.   HPI   Thumb Swelling: Patient reports today he was working out at work when he missed catching a 30 pound medicine ball per patient.  He states that the ball hit his right thumb.  Pain 2 out of 10 and worse with movement.  She is able to perform range of motion of the thumb but this gets more challenging in the snuffbox area where most of the pain is located.  He denies any previous injury to this area.  No numbness or skin color changes. Has not taken anything for pain and declines pain medication at this time.   Past Medical History:  Diagnosis Date  . Elevated blood pressure reading without diagnosis of hypertension   . Family history of breast cancer    mother and grandmother  . Hernia   . Shoulder dislocation, recurrent    right    Patient Active Problem List   Diagnosis Date Noted  . Inguinal hernia 05/27/2011  . Elevated blood pressure reading without diagnosis of hypertension 05/27/2011  . Scrotal mass 05/27/2011    History reviewed. No pertinent surgical history.     Home Medications    Prior to Admission medications   Not on File    Family History Family History  Problem Relation Age of Onset  . Colon cancer Neg Hx   . Prostate cancer Neg Hx   . Diabetes Neg Hx   . Coronary artery disease Neg Hx   . Cancer Mother        breast    Social History Social History   Tobacco Use  . Smoking status: Never Smoker  Substance Use Topics  . Alcohol use: Yes    Comment: socially   . Drug use: No     Allergies   Patient has no known allergies.   Review of Systems Review of Systems  As stated above in HPI Physical Exam Triage Vital Signs ED Triage Vitals  Enc Vitals Group     BP 04/19/21 1831 (!) 143/93     Pulse Rate 04/19/21  1831 65     Resp 04/19/21 1831 18     Temp 04/19/21 1831 98.2 F (36.8 C)     Temp src --      SpO2 04/19/21 1831 99 %     Weight --      Height --      Head Circumference --      Peak Flow --      Pain Score 04/19/21 1830 2     Pain Loc --      Pain Edu? --      Excl. in GC? --    No data found.  Updated Vital Signs BP (!) 143/93   Pulse 65   Temp 98.2 F (36.8 C)   Resp 18   SpO2 99%   Physical Exam Vitals and nursing note reviewed.  Constitutional:      General: He is not in acute distress.    Appearance: Normal appearance. He is not ill-appearing, toxic-appearing or diaphoretic.  Cardiovascular:     Pulses: Normal pulses.  Musculoskeletal:        General: Swelling (Right thumb) and tenderness (snuffbox area) present. No deformity or  signs of injury. Normal range of motion.  Skin:    General: Skin is warm.     Capillary Refill: Capillary refill takes less than 2 seconds.     Coloration: Skin is not jaundiced or pale.     Findings: No bruising.     Comments: No skin breakdown  Neurological:     Mental Status: He is alert.      UC Treatments / Results  Labs (all labs ordered are listed, but only abnormal results are displayed) Labs Reviewed - No data to display  EKG   Radiology No results found.  Procedures Procedures (including critical care time)  Medications Ordered in UC Medications - No data to display  Initial Impression / Assessment and Plan / UC Course  I have reviewed the triage vital signs and the nursing notes.  Pertinent labs & imaging results that were available during my care of the patient were reviewed by me and considered in my medical decision making (see chart for details).     New.  X-ray pending. 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 NSAIDs or Tylenol as needed along with cold compress.  Final Clinical Impressions(s)  / UC Diagnoses   Final diagnoses:  None   Discharge Instructions   None    ED Prescriptions    None     PDMP not reviewed this encounter.   Rushie Chestnut, [place] 04/19/21 1949

## 2021-04-19 NOTE — ED Triage Notes (Signed)
Pt c/o pain and swelling to base of right thumb after catching a 30lb medicine ball improperly today around 4pm. Able to move thumb and denies any numbness.

## 2022-09-07 IMAGING — DX DG WRIST COMPLETE 3+V*R*
4 series · 4 of 4 positions shown · non-contrast
Comparison: None.

CLINICAL DATA: Pain, swelling in snuffbox area.

EXAM:
RIGHT WRIST - COMPLETE 3+ VIEW

[wrist pa]
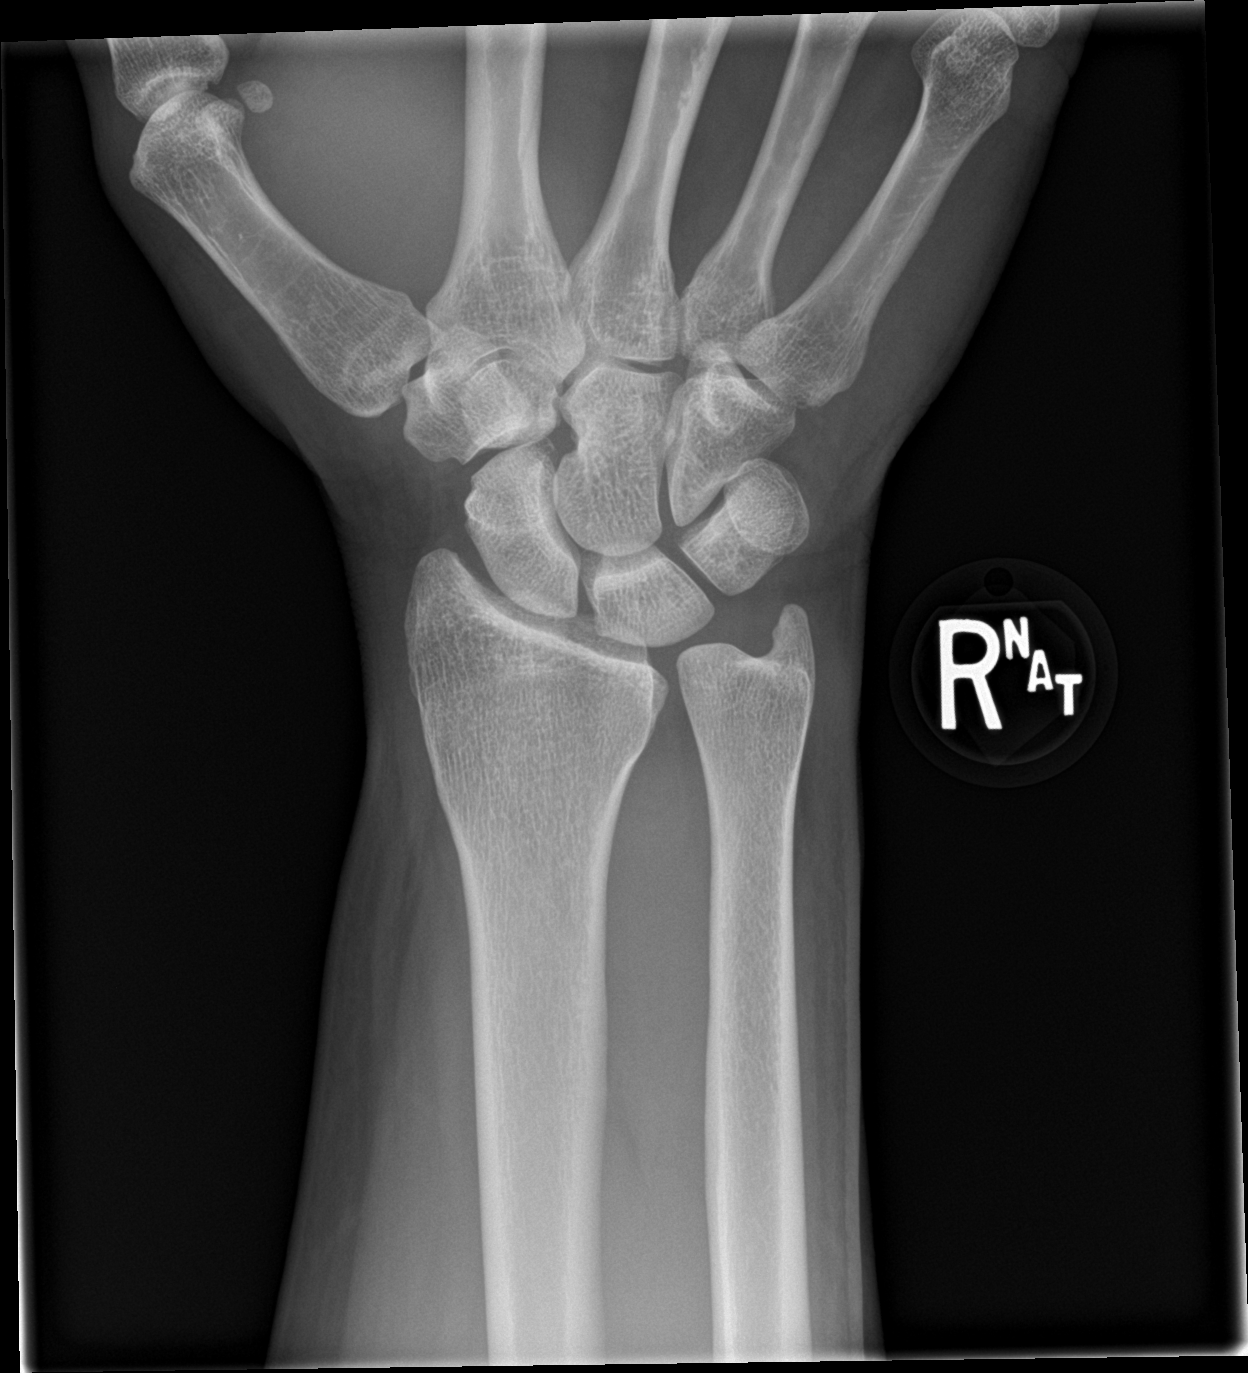

[wrist navicular]
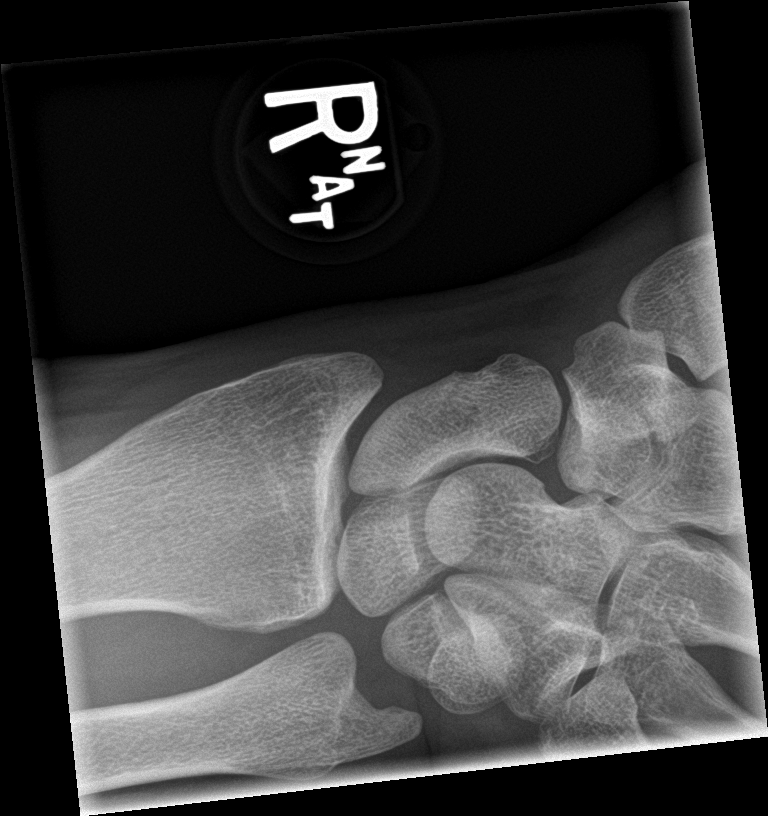

[wrist obl]
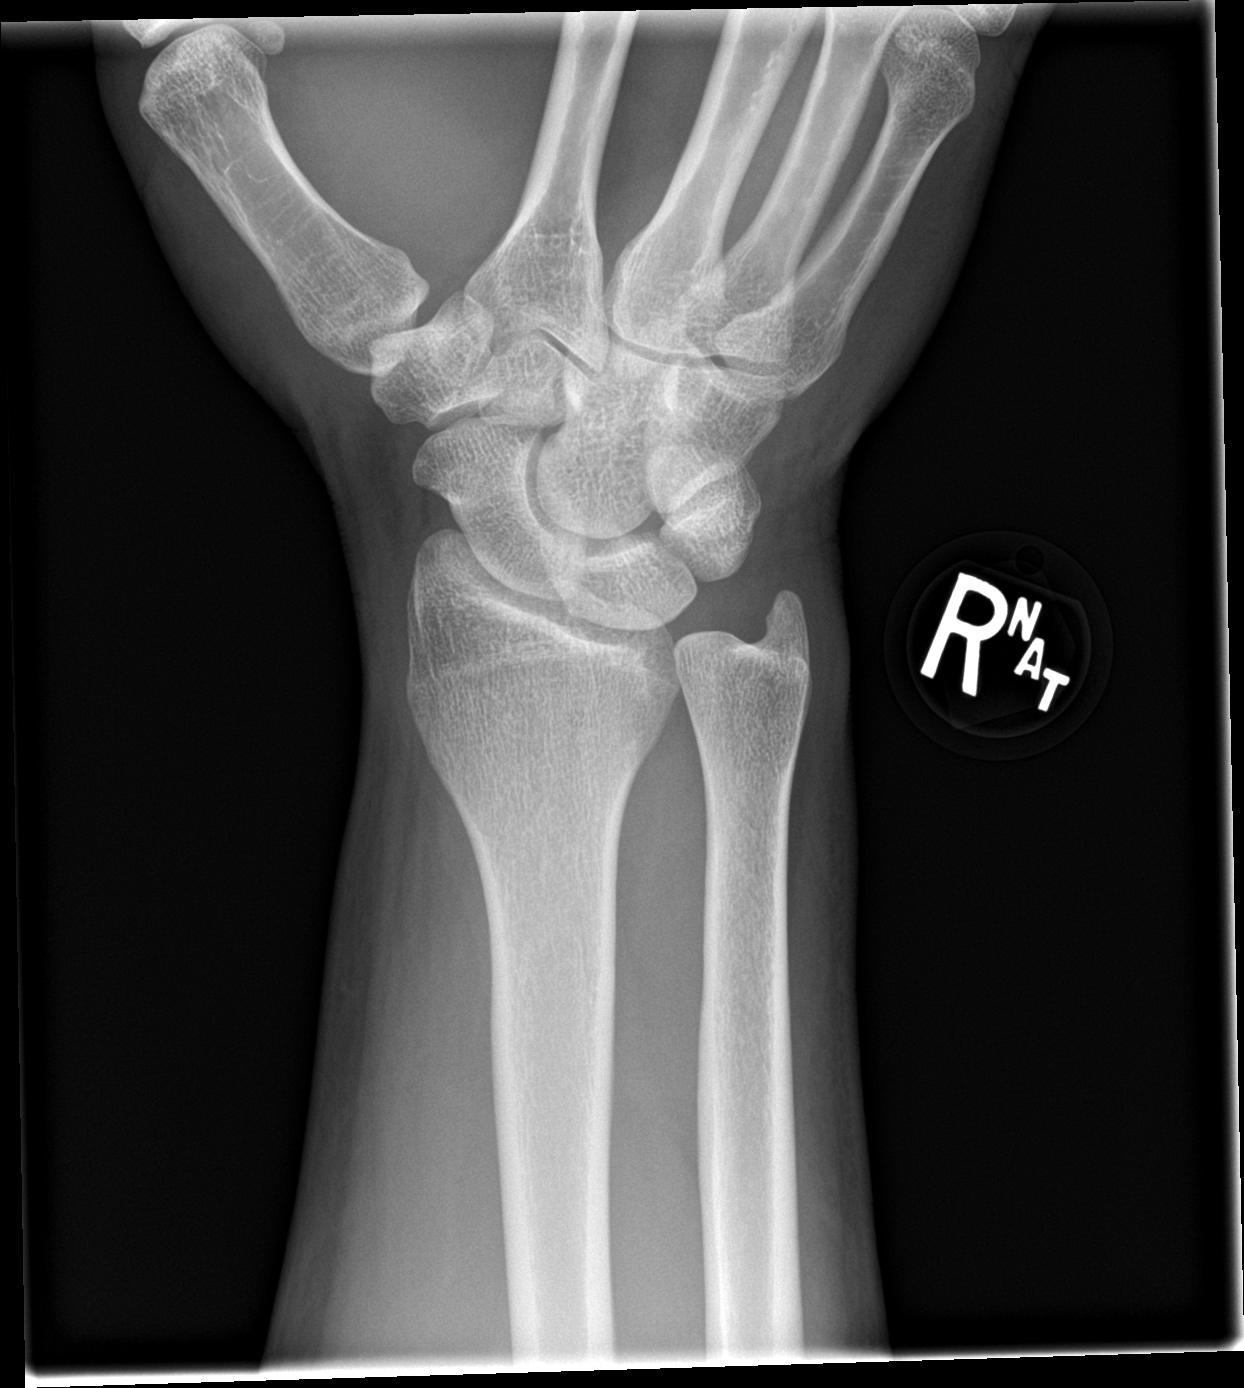

[wrist lat]
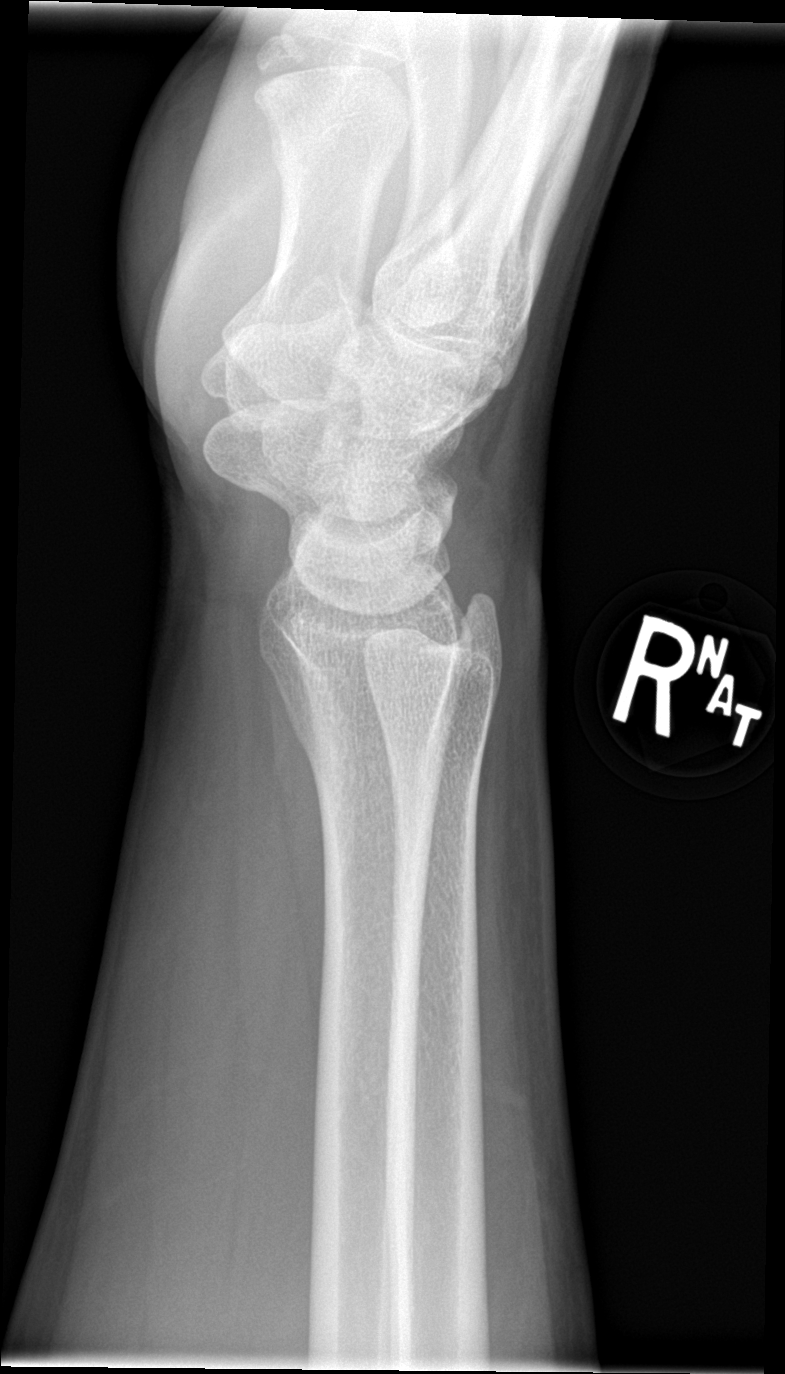

[4 of 4 positions shown; findings below may reference images not displayed]

FINDINGS: There is no evidence of fracture or dislocation. There is no
evidence of arthropathy or other focal bone abnormality. Soft
tissues are unremarkable.
IMPRESSION: Negative.

## 2024-09-06 ENCOUNTER — Other Ambulatory Visit (HOSPITAL_BASED_OUTPATIENT_CLINIC_OR_DEPARTMENT_OTHER): Payer: Self-pay | Admitting: Family Medicine

## 2024-09-06 DIAGNOSIS — Z8249 Family history of ischemic heart disease and other diseases of the circulatory system: Secondary | ICD-10-CM

## 2024-09-28 ENCOUNTER — Ambulatory Visit (HOSPITAL_BASED_OUTPATIENT_CLINIC_OR_DEPARTMENT_OTHER)
Admission: RE | Admit: 2024-09-28 | Discharge: 2024-09-28 | Disposition: A | Discharge status: Home / Self Care | Payer: Self-pay | Source: Ambulatory Visit | Attending: Family Medicine | Admitting: Family Medicine

## 2024-09-28 DIAGNOSIS — Z8249 Family history of ischemic heart disease and other diseases of the circulatory system: Secondary | ICD-10-CM
